# Patient Record
Sex: Male | Born: 1965
Health system: Southern US, Community
[De-identification: ages and names within clinical notes are randomized; demographics above are authoritative.]

---

## 2002-06-25 HISTORY — PX: APPENDECTOMY: SHX54

## 2006-12-25 ENCOUNTER — Ambulatory Visit: Payer: Self-pay | Admitting: Gastroenterology

## 2006-12-25 LAB — HM COLONOSCOPY

## 2007-02-04 DIAGNOSIS — L0592 Pilonidal sinus without abscess: Secondary | ICD-10-CM | POA: Insufficient documentation

## 2007-11-18 DIAGNOSIS — R319 Hematuria, unspecified: Secondary | ICD-10-CM | POA: Insufficient documentation

## 2009-04-26 DIAGNOSIS — E559 Vitamin D deficiency, unspecified: Secondary | ICD-10-CM | POA: Insufficient documentation

## 2010-07-25 ENCOUNTER — Ambulatory Visit: Payer: Self-pay

## 2012-03-20 IMAGING — CR DG CHEST 2V
1 series · 2 of 2 positions shown · non-contrast
Comparison: none

REASON FOR EXAM: chest pain
COMMENTS:

PROCEDURE:     KDR - KDXR CHEST PA (OR AP) AND LAT  - July 25, 2010 [DATE]
RESULT:     The lung fields are clear. The heart, mediastinal and osseous
structures show no significant abnormalities.

[Series 1: view not recorded · 0.17mm/px · 2 of 2 slices shown]
[im 1/2]
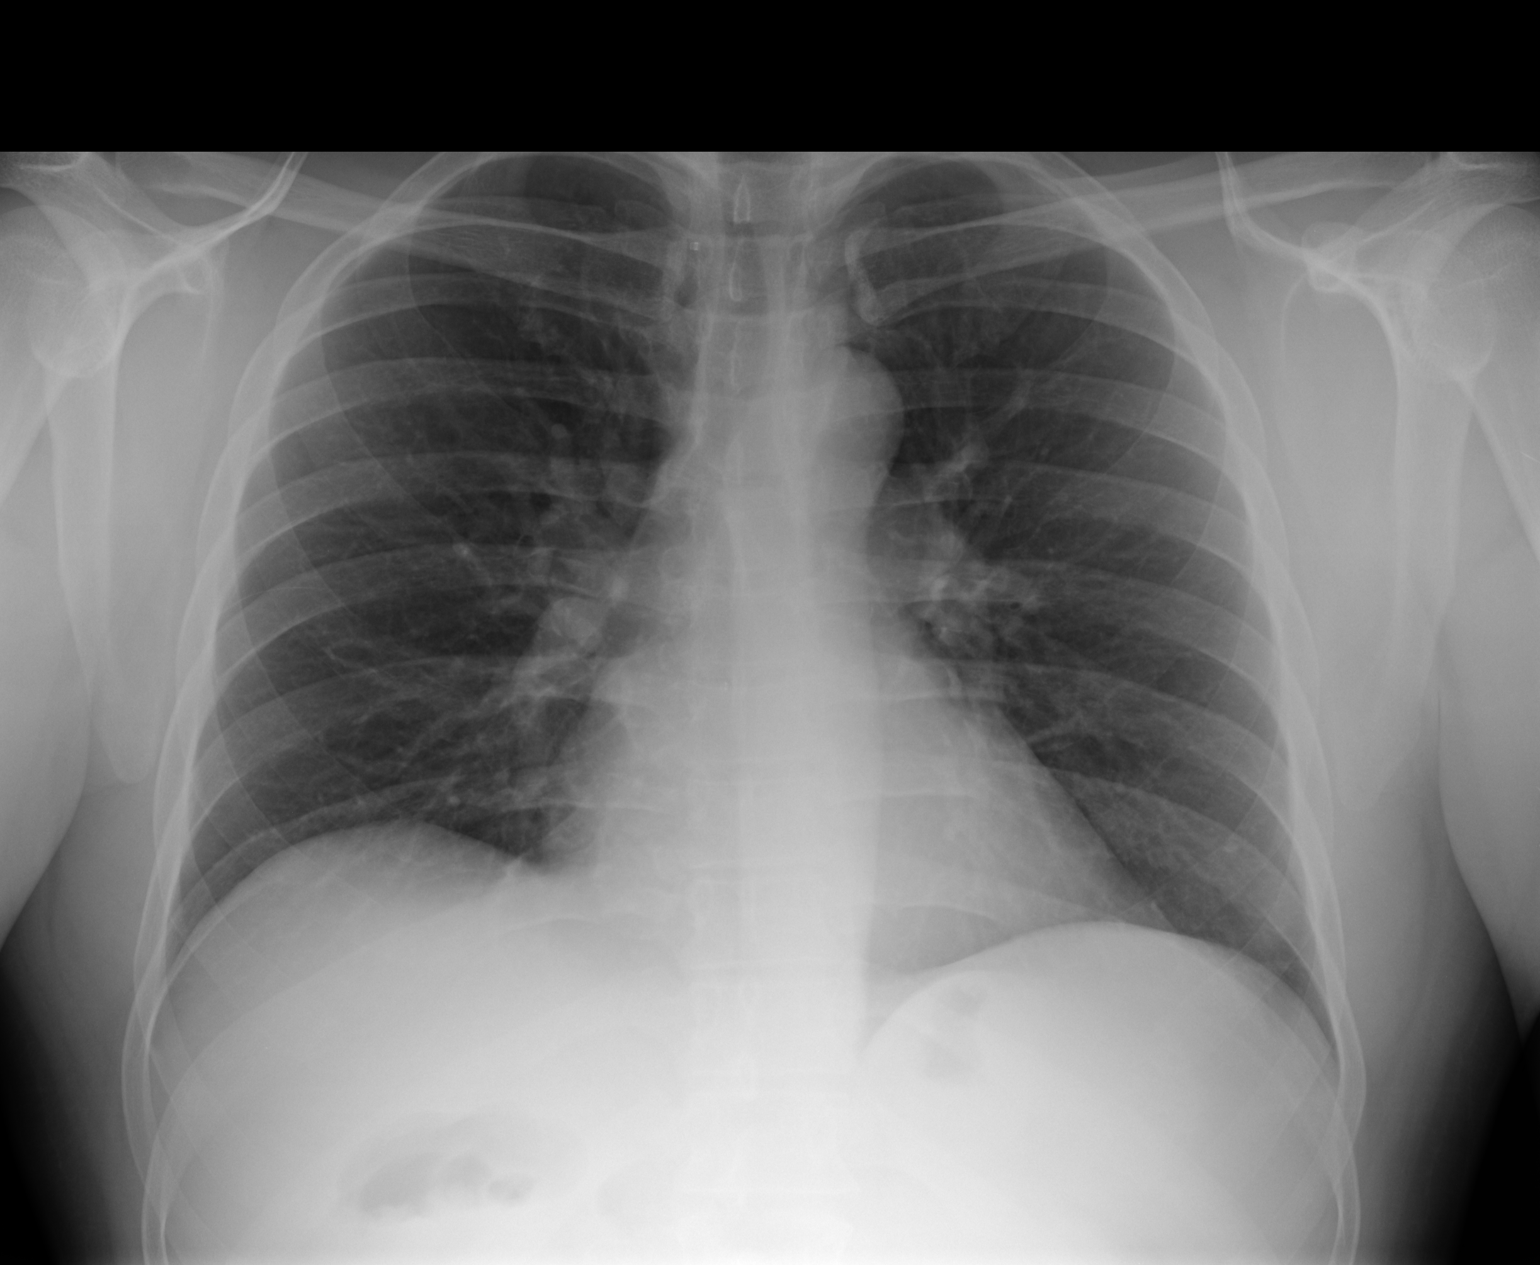
[im 2/2]
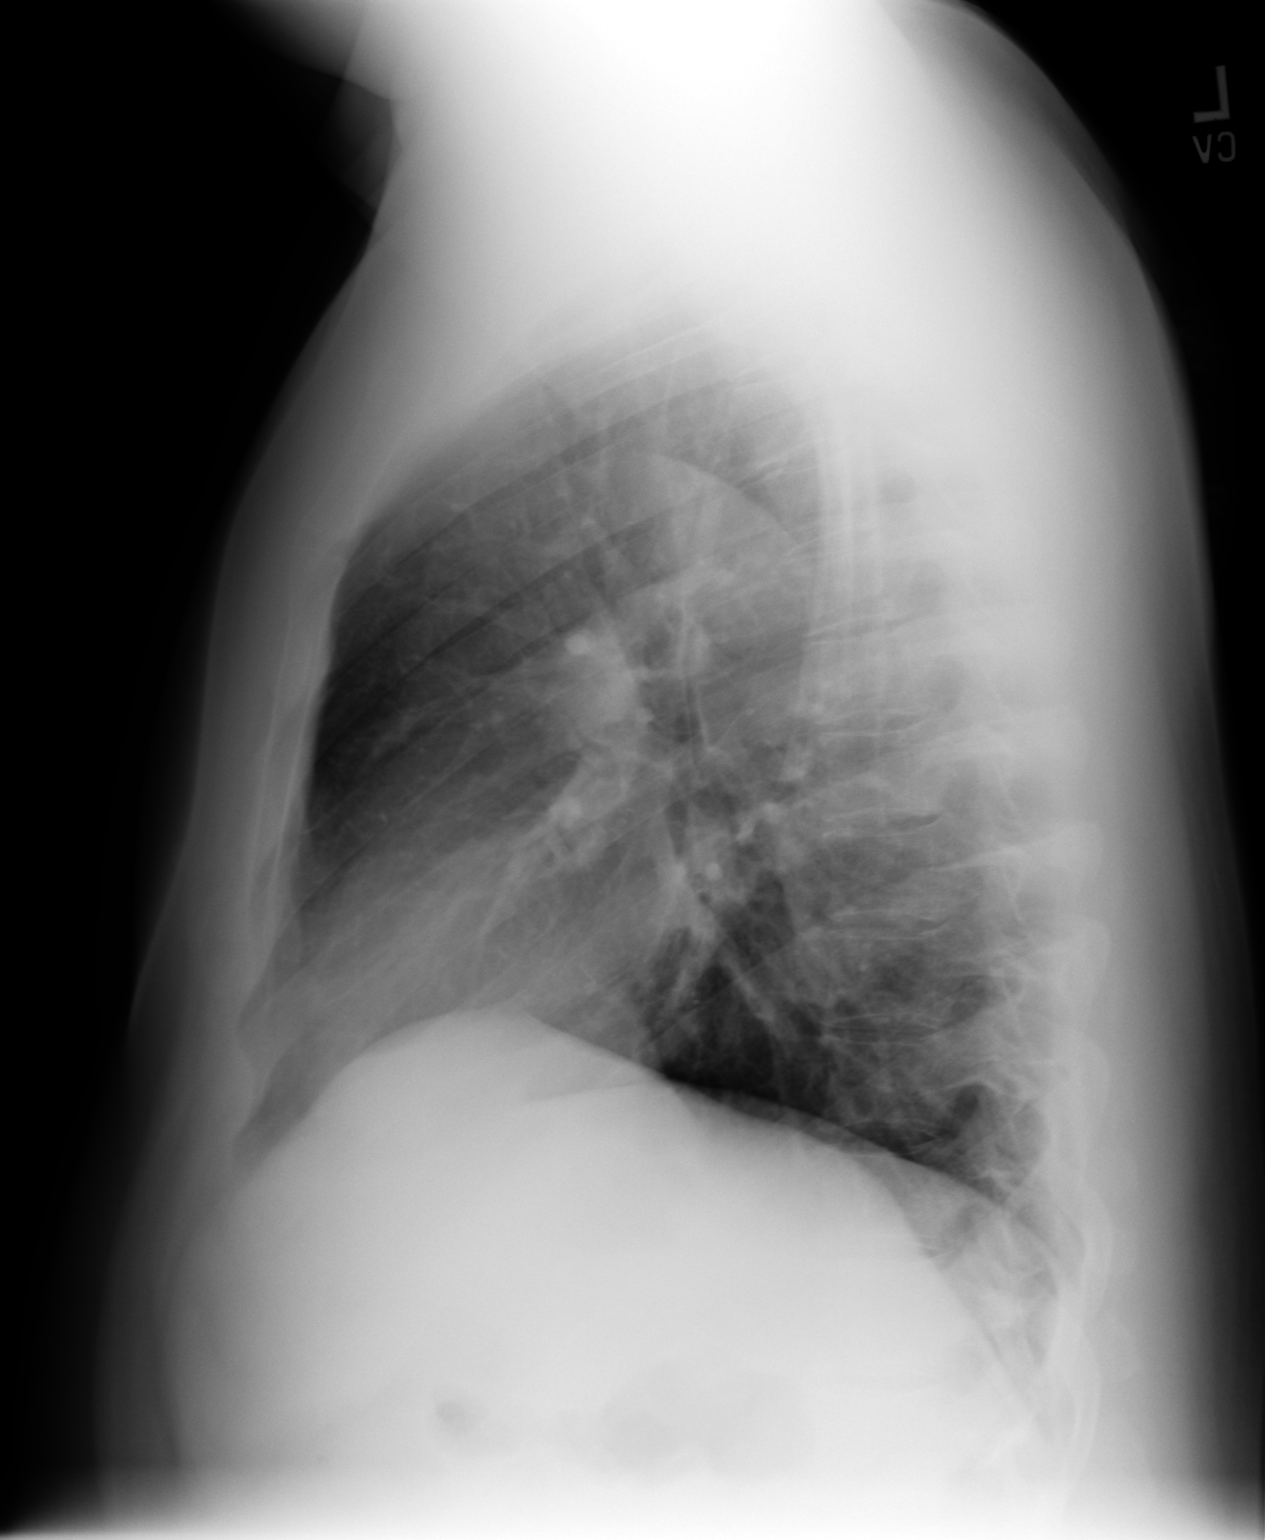

[2 of 2 positions shown; findings below may reference images not displayed]

IMPRESSION: 1.     No significant abnormalities are noted.

## 2014-04-13 LAB — BASIC METABOLIC PANEL
BUN: 12 mg/dL (ref 4–21)
CREATININE: 1.1 mg/dL (ref 0.6–1.3)
GLUCOSE: 110 mg/dL
POTASSIUM: 4.7 mmol/L (ref 3.4–5.3)
SODIUM: 139 mmol/L (ref 137–147)

## 2014-04-13 LAB — CBC AND DIFFERENTIAL
HCT: 41 % (ref 41–53)
HEMOGLOBIN: 14.2 g/dL (ref 13.5–17.5)
Neutrophils Absolute: 3 /uL
Platelets: 211 10*3/uL (ref 150–399)
WBC: 5.8 10^3/mL

## 2014-04-13 LAB — TSH: TSH: 2.46 u[IU]/mL (ref 0.41–5.90)

## 2014-04-13 LAB — LIPID PANEL
Cholesterol: 227 mg/dL — AB (ref 0–200)
HDL: 44 mg/dL (ref 35–70)
LDL Cholesterol: 160 mg/dL
LDL/HDL RATIO: 3.6
Triglycerides: 117 mg/dL (ref 40–160)

## 2014-04-13 LAB — HEPATIC FUNCTION PANEL
ALT: 16 U/L (ref 10–40)
AST: 18 U/L (ref 14–40)
Alkaline Phosphatase: 83 U/L (ref 25–125)
BILIRUBIN, TOTAL: 0.3 mg/dL

## 2014-04-13 LAB — PSA: PSA: 0.5

## 2015-11-16 ENCOUNTER — Encounter: Payer: Self-pay | Admitting: Emergency Medicine

## 2015-11-16 DIAGNOSIS — K648 Other hemorrhoids: Secondary | ICD-10-CM | POA: Insufficient documentation

## 2015-11-16 DIAGNOSIS — M1990 Unspecified osteoarthritis, unspecified site: Secondary | ICD-10-CM | POA: Insufficient documentation

## 2015-11-16 DIAGNOSIS — M940 Chondrocostal junction syndrome [Tietze]: Secondary | ICD-10-CM | POA: Insufficient documentation

## 2015-11-16 DIAGNOSIS — N429 Disorder of prostate, unspecified: Secondary | ICD-10-CM | POA: Insufficient documentation

## 2015-12-01 ENCOUNTER — Encounter: Payer: Self-pay | Admitting: Family Medicine

## 2015-12-01 ENCOUNTER — Ambulatory Visit (INDEPENDENT_AMBULATORY_CARE_PROVIDER_SITE_OTHER): Payer: BLUE CROSS/BLUE SHIELD | Admitting: Family Medicine

## 2015-12-01 VITALS — BP 118/82 | HR 72 | Temp 97.8°F | Resp 16 | Ht 72.0 in | Wt 211.0 lb

## 2015-12-01 DIAGNOSIS — Z0000 Encounter for general adult medical examination without abnormal findings: Secondary | ICD-10-CM

## 2015-12-01 DIAGNOSIS — R079 Chest pain, unspecified: Secondary | ICD-10-CM | POA: Diagnosis not present

## 2015-12-01 DIAGNOSIS — Z1211 Encounter for screening for malignant neoplasm of colon: Secondary | ICD-10-CM | POA: Diagnosis not present

## 2015-12-01 DIAGNOSIS — R195 Other fecal abnormalities: Secondary | ICD-10-CM

## 2015-12-01 DIAGNOSIS — Z125 Encounter for screening for malignant neoplasm of prostate: Secondary | ICD-10-CM

## 2015-12-01 LAB — POCT URINALYSIS DIPSTICK
BILIRUBIN UA: NEGATIVE
GLUCOSE UA: NEGATIVE
KETONES UA: NEGATIVE
Leukocytes, UA: NEGATIVE
Nitrite, UA: NEGATIVE
Protein, UA: NEGATIVE
RBC UA: NEGATIVE
SPEC GRAV UA: 1.01
UROBILINOGEN UA: 0.2
pH, UA: 5

## 2015-12-01 LAB — IFOBT (OCCULT BLOOD): IMMUNOLOGICAL FECAL OCCULT BLOOD TEST: POSITIVE

## 2015-12-01 NOTE — Patient Instructions (Signed)

## 2015-12-01 NOTE — Progress Notes (Signed)
Patient ID: Seth Graham, male   DOB: 10/11/1965, 50 y.o.   MRN: [id number]       Patient: Seth Graham, Male    DOB: 08/16/1965, 50 y.o.   MRN: [id number] Visit Date: 12/01/2015  Today's Provider: Megan Mansichard Gilbert Jr, MD   Chief Complaint  Patient presents with  . Annual Exam   Subjective:    Annual physical exam Seth Graham is a 50 y.o. male who presents today for health maintenance and complete physical. He feels fairly well. He reports he is not exercising. He reports he is sleeping fairly well.  -----------------------------------------------------------------  Colonoscopy- 12/24/16 Diverticulosis, internal hemorrhoids repeat 10 years  Immunization History  Administered Date(s) Administered  . Tdap 10/16/2006     Chest pain- Pt reports that he has chest tightness over his left side of chest. It happens at random times, no exertional. He reports that the chest pain last about 30 seconds. He has had arm tingling in the left arm, but has gone away. No shortness of breath, sweats, or nausea. Pt reports that he has only felt this a couple of times.    Review of Systems  Constitutional: Negative.   HENT: Negative.   Eyes: Negative.   Respiratory: Negative.   Cardiovascular: Positive for chest pain (tightness on left side of chest).  Gastrointestinal: Negative.   Endocrine: Negative.   Genitourinary: Negative.   Musculoskeletal: Positive for back pain.  Skin: Negative.   Allergic/Immunologic: Negative.   Neurological: Negative.   Hematological: Negative.   Psychiatric/Behavioral: Negative.     Social History      He  reports that he has never smoked. He does not have any smokeless tobacco history on file. He reports that he does not drink alcohol or use illicit drugs.       Social History   Social History  . Marital Status: Married    Spouse Name: N/A  . Number of Children: N/A  . Years of Education: N/A   Social History Main Topics  . Smoking status: Never  Smoker   . Smokeless tobacco: None  . Alcohol Use: No  . Drug Use: No  . Sexual Activity: Not Asked   Other Topics Concern  . None   Social History Narrative    History reviewed. No pertinent past medical history.   Patient Active Problem List   Diagnosis Date Noted  . Arthritis 11/16/2015  . 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 Hemorrhoids, internal 11/16/2015  . Disorder of prostate 11/16/2015  . Avitaminosis D 04/26/2009  . Blood in the urine 11/18/2007  . 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 Appendectomy  2004    Family History        Family Status  Relation Status Death Age  . Mother Alive   . Father Alive   . Sister Alive   . Sister Alive         His family history includes COPD in his father; Cancer in his father.    No Known Allergies  No outpatient prescriptions have been marked as taking for the 12/01/15 encounter (Office Visit) with Maple Hudsonichard L Gilbert Jr., MD.    Patient Care Team: Maple Hudsonichard L Gilbert Jr., MD as PCP - General (Family Medicine)     Objective:   Vitals: BP 118/82 mmHg  Pulse 72  Temp(Src) 97.8 F (36.6 C) (Oral)  Resp 16  Ht 6' (1.829 m)  Wt 211 lb (95.709 kg)  BMI 28.61 kg/m2  SpO2 97%   Physical Exam  Constitutional: He is oriented to person, place, and time. He appears well-developed and well-nourished.  HENT:  Head: Normocephalic and atraumatic.  Right Ear: External ear normal.  Left Ear: External ear normal.  Mouth/Throat: Oropharynx is clear and moist.  Eyes: Conjunctivae and EOM are normal. Pupils are equal, round, and reactive to light.  Neck: Normal range of motion. Neck supple.  Cardiovascular: Normal rate, regular rhythm, normal heart sounds and intact distal pulses.   Pulmonary/Chest: Effort normal and breath sounds normal.  Abdominal: Soft. Bowel sounds are normal.  Genitourinary: Prostate normal and penis normal.  Musculoskeletal: Normal range of motion.    Neurological: He is alert and oriented to person, place, and time. He has normal reflexes.  Skin: Skin is warm and dry.  Psychiatric: He has a normal mood and affect. His behavior is normal. Judgment and thought content normal.     Depression Screen PHQ 2/9 Scores 12/01/2015  PHQ - 2 Score 0      Assessment & Plan:     Routine Health Maintenance and Physical Exam  Exercise Activities and Dietary recommendations Goals    None      Immunization History  Administered Date(s) Administered  . Tdap 10/16/2006    Health Maintenance  Topic Date Due  . HIV Screening  08/31/1981  . INFLUENZA VACCINE  01/24/2016  . TETANUS/TDAP  10/15/2016    Stool is OC positive today--go ahead and refer for colonoscopy which is due in the next year. Discussed health benefits of physical activity, and encouraged him to engage in regular e  exercise appropriate for his age and condition.              Left Thumb Bursitis             Acute on Chronic Low Back Strain             Consider PT referral after LS xray.             I have done the exam and reviewed the above chart and it is accurate to the best of my knowledge.  --------------------------------------------------------------------

## 2015-12-02 LAB — LIPID PANEL WITH LDL/HDL RATIO
CHOLESTEROL TOTAL: 213 mg/dL — AB (ref 100–199)
HDL: 41 mg/dL (ref >39)
LDL Calculated: 143 mg/dL — ABNORMAL HIGH (ref 0–99)
LDl/HDL Ratio: 3.5 ratio units (ref 0.0–3.6)
TRIGLYCERIDES: 143 mg/dL (ref 0–149)
VLDL Cholesterol Cal: 29 mg/dL (ref 5–40)

## 2015-12-02 LAB — CBC WITH DIFFERENTIAL/PLATELET
BASOS ABS: 0 10*3/uL (ref 0.0–0.2)
Basos: 1 %
EOS (ABSOLUTE): 0.2 10*3/uL (ref 0.0–0.4)
Eos: 3 %
HEMOGLOBIN: 14.1 g/dL (ref 12.6–17.7)
Hematocrit: 41.1 % (ref 37.5–51.0)
Immature Grans (Abs): 0 10*3/uL (ref 0.0–0.1)
Immature Granulocytes: 0 %
LYMPHS ABS: 2.1 10*3/uL (ref 0.7–3.1)
Lymphs: 34 %
MCH: 26.8 pg (ref 26.6–33.0)
MCHC: 34.3 g/dL (ref 31.5–35.7)
MCV: 78 fL — ABNORMAL LOW (ref 79–97)
MONOCYTES: 10 %
Monocytes Absolute: 0.6 10*3/uL (ref 0.1–0.9)
Neutrophils Absolute: 3.3 10*3/uL (ref 1.4–7.0)
Neutrophils: 52 %
PLATELETS: 209 10*3/uL (ref 150–379)
RBC: 5.27 x10E6/uL (ref 4.14–5.80)
RDW: 13.7 % (ref 12.3–15.4)
WBC: 6.3 10*3/uL (ref 3.4–10.8)

## 2015-12-02 LAB — PSA: PROSTATE SPECIFIC AG, SERUM: 0.7 ng/mL (ref 0.0–4.0)

## 2015-12-02 LAB — COMPREHENSIVE METABOLIC PANEL
A/G RATIO: 1.7 (ref 1.2–2.2)
ALBUMIN: 4.5 g/dL (ref 3.5–5.5)
ALT: 18 IU/L (ref 0–44)
AST: 19 IU/L (ref 0–40)
Alkaline Phosphatase: 84 IU/L (ref 39–117)
BUN/Creatinine Ratio: 15 (ref 9–20)
BUN: 14 mg/dL (ref 6–24)
Bilirubin Total: 0.3 mg/dL (ref 0.0–1.2)
CALCIUM: 9.3 mg/dL (ref 8.7–10.2)
CO2: 26 mmol/L (ref 18–29)
CREATININE: 0.92 mg/dL (ref 0.76–1.27)
Chloride: 100 mmol/L (ref 96–106)
GFR calc Af Amer: 113 mL/min/{1.73_m2} (ref >59)
GFR calc non Af Amer: 97 mL/min/{1.73_m2} (ref >59)
GLOBULIN, TOTAL: 2.6 g/dL (ref 1.5–4.5)
Glucose: 100 mg/dL — ABNORMAL HIGH (ref 65–99)
POTASSIUM: 4.6 mmol/L (ref 3.5–5.2)
SODIUM: 140 mmol/L (ref 134–144)
TOTAL PROTEIN: 7.1 g/dL (ref 6.0–8.5)

## 2015-12-02 LAB — TSH: TSH: 2.28 u[IU]/mL (ref 0.450–4.500)

## 2015-12-05 ENCOUNTER — Telehealth: Payer: Self-pay | Admitting: Family Medicine

## 2015-12-05 NOTE — Telephone Encounter (Signed)
Pt states he received a call and is returning the call.  He states he think it is his lab results.

## 2015-12-14 ENCOUNTER — Encounter: Payer: Self-pay | Admitting: Family Medicine

## 2016-03-05 ENCOUNTER — Ambulatory Visit: Payer: Self-pay | Admitting: Family Medicine

## 2016-03-14 ENCOUNTER — Ambulatory Visit (INDEPENDENT_AMBULATORY_CARE_PROVIDER_SITE_OTHER): Payer: BLUE CROSS/BLUE SHIELD | Admitting: Family Medicine

## 2016-03-14 VITALS — BP 118/84 | HR 60 | Temp 98.3°F | Resp 12 | Wt 209.0 lb

## 2016-03-14 DIAGNOSIS — R7989 Other specified abnormal findings of blood chemistry: Secondary | ICD-10-CM

## 2016-03-14 DIAGNOSIS — R059 Cough, unspecified: Secondary | ICD-10-CM

## 2016-03-14 DIAGNOSIS — Z1211 Encounter for screening for malignant neoplasm of colon: Secondary | ICD-10-CM

## 2016-03-14 DIAGNOSIS — S39012A Strain of muscle, fascia and tendon of lower back, initial encounter: Secondary | ICD-10-CM

## 2016-03-14 DIAGNOSIS — R05 Cough: Secondary | ICD-10-CM

## 2016-03-14 MED ORDER — OMEPRAZOLE 20 MG PO CPDR: 20.0000 mg | DELAYED_RELEASE_CAPSULE | Freq: Every day | ORAL | 12 refills | Status: DC

## 2016-03-14 MED ORDER — LORATADINE 10 MG PO TABS: 10.0000 mg | ORAL_TABLET | Freq: Every day | ORAL | 11 refills | Status: DC

## 2016-03-14 MED ORDER — GUAIFENESIN ER 600 MG PO TB12: 600.0000 mg | ORAL_TABLET | Freq: Every day | ORAL | 0 refills | Status: DC | PRN

## 2016-03-14 NOTE — Progress Notes (Signed)
Seth GritCharles Graham  MRN: [id number] DOB: 12/25/1965  Subjective:  HPI  Patient is here for 3 months follow up on labs. Las labs were done in June and possible iron def anemia was mentioned. He was to come back in and have labs re checked. He does have some fatigue and can not say if it is a normal issue or not. Also states he has had some pain for a while in the lower back right side and hip area.  Patient Active Problem List   Diagnosis Date Noted  . Arthritis 11/16/2015  . 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 Hemorrhoids, internal 11/16/2015  . Disorder of prostate 11/16/2015  . Avitaminosis D 04/26/2009  . Blood in the urine 11/18/2007  . 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  Social History   Social History  . Marital status: Married    Spouse name: N/A  . Number of children: N/A  . Years of education: N/A   Occupational History  . Not on file.   Social History Main Topics  . Smoking status: Never Smoker  . Smokeless tobacco: Not on file  . Alcohol use No  . Drug use: No  . Sexual activity: Not on file   Other Topics Concern  . Not on file   Social History Narrative  . No narrative on file    Outpatient Encounter Prescriptions as of 03/14/2016  Medication Sig Note  . fluticasone (FLONASE) 50 MCG/ACT nasal spray  12/01/2015: [facility]   No facility-administered encounter medications on file as of 03/14/2016.     No Known Allergies  Review of Systems  Constitutional: Positive for malaise/fatigue.  Respiratory: Positive for cough (at times).   Cardiovascular: Negative.   Musculoskeletal: Positive for back pain and joint pain.  Neurological: Negative.    Objective:  BP 118/84   Pulse 60   Temp 98.3 F (36.8 C)   Resp 12   Wt 209 lb (94.8 kg)   BMI 28.35 kg/m   Physical Exam  Constitutional: He is oriented to person, place, and time and well-developed, well-nourished, and in no distress.  HENT:    Head: Normocephalic and atraumatic.  Eyes: Conjunctivae are normal. Pupils are equal, round, and reactive to light.  Neck: Normal range of motion. Neck supple.  Cardiovascular: Normal rate, regular rhythm, normal heart sounds and intact distal pulses.   No murmur heard. Pulmonary/Chest: Effort normal and breath sounds normal. No respiratory distress. He has no wheezes.  Musculoskeletal: He exhibits no edema, tenderness or deformity.  Negative 4 figure -could be psoas issue  Neurological: He is alert and oriented to person, place, and time. No cranial nerve deficit. He exhibits normal muscle tone. Gait normal. Coordination normal.  Skin: Skin is warm and dry.  Psychiatric: Mood, memory, affect and judgment normal.    Assessment and Plan :  1. Low back strain, initial encounter Possible psois strain. Advised patient to stretch and follow for now. Patient wants to hold off on further work up at this time. I think this is musculoskeletal. Could definitely benefit from a physical therapy referral. Will let us know.. 2. Abnormal MCV Re check levels today.  3. Cough Advised patient to try Loratadine, Flonase and Mucinex OTC for about 1 month. Then add Omeprazole if symptoms do not improve. AR vs GERD. May need referral to allergist. Follow.  HPI, Exam and A&P transcribed under direction and in the presence of Julieanne Mansonichard Gilbert, MD.

## 2016-03-15 LAB — CBC WITH DIFFERENTIAL/PLATELET
BASOS ABS: 0 10*3/uL (ref 0.0–0.2)
Basos: 1 %
EOS (ABSOLUTE): 0.2 10*3/uL (ref 0.0–0.4)
Eos: 3 %
HEMOGLOBIN: 14.6 g/dL (ref 12.6–17.7)
Hematocrit: 42.1 % (ref 37.5–51.0)
IMMATURE GRANS (ABS): 0 10*3/uL (ref 0.0–0.1)
IMMATURE GRANULOCYTES: 0 %
LYMPHS: 35 %
Lymphocytes Absolute: 2.3 10*3/uL (ref 0.7–3.1)
MCH: 27.1 pg (ref 26.6–33.0)
MCHC: 34.7 g/dL (ref 31.5–35.7)
MCV: 78 fL — ABNORMAL LOW (ref 79–97)
MONOCYTES: 10 %
Monocytes Absolute: 0.6 10*3/uL (ref 0.1–0.9)
NEUTROS PCT: 51 %
Neutrophils Absolute: 3.4 10*3/uL (ref 1.4–7.0)
PLATELETS: 214 10*3/uL (ref 150–379)
RBC: 5.38 x10E6/uL (ref 4.14–5.80)
RDW: 14.5 % (ref 12.3–15.4)
WBC: 6.5 10*3/uL (ref 3.4–10.8)

## 2016-03-15 LAB — IRON: Iron: 115 ug/dL (ref 38–169)

## 2016-12-03 ENCOUNTER — Ambulatory Visit (INDEPENDENT_AMBULATORY_CARE_PROVIDER_SITE_OTHER): Payer: BLUE CROSS/BLUE SHIELD | Admitting: Family Medicine

## 2016-12-03 ENCOUNTER — Encounter: Payer: Self-pay | Admitting: Family Medicine

## 2016-12-03 VITALS — BP 114/62 | HR 70 | Temp 97.5°F | Resp 16 | Ht 72.0 in | Wt 210.0 lb

## 2016-12-03 DIAGNOSIS — Z1211 Encounter for screening for malignant neoplasm of colon: Secondary | ICD-10-CM | POA: Diagnosis not present

## 2016-12-03 DIAGNOSIS — Z23 Encounter for immunization: Secondary | ICD-10-CM

## 2016-12-03 DIAGNOSIS — Z0000 Encounter for general adult medical examination without abnormal findings: Secondary | ICD-10-CM

## 2016-12-03 DIAGNOSIS — Z125 Encounter for screening for malignant neoplasm of prostate: Secondary | ICD-10-CM

## 2016-12-03 LAB — POCT URINALYSIS DIPSTICK
Bilirubin, UA: NEGATIVE
Blood, UA: NEGATIVE
Glucose, UA: NEGATIVE
Ketones, UA: NEGATIVE
Leukocytes, UA: NEGATIVE
NITRITE UA: NEGATIVE
PH UA: 6 (ref 5.0–8.0)
PROTEIN UA: NEGATIVE
Spec Grav, UA: 1.01 (ref 1.010–1.025)
UROBILINOGEN UA: 0.2 U/dL

## 2016-12-03 NOTE — Progress Notes (Signed)
Patient: Seth Graham, Male    DOB: 21-Jul-1965, 51 y.o.   MRN: [id number] Visit Date: 12/03/2016  Today's Provider: [name], MD   Chief Complaint  Patient presents with  . Annual Exam   Subjective:    Annual physical exam Seth Graham is a 51 y.o. male who presents today for health maintenance and complete physical. He feels well. He reports he is not exercising. He reports he is sleeping well.  ----------------------------------------------------------------- Colonoscopy- 12/25/06 diverticulosis and internal hemorrhoids  Immunization History  Administered Date(s) Administered  . Tdap 10/16/2006     Review of Systems  Constitutional: Negative.   HENT: Positive for sinus pressure.   Eyes: Negative.   Respiratory: Negative.   Cardiovascular: Negative.   Gastrointestinal: Negative.   Endocrine: Negative.   Genitourinary: Negative.   Musculoskeletal: Negative.   Skin: Positive for rash.  Allergic/Immunologic: Negative.   Neurological: Positive for headaches.  Hematological: Negative.   Psychiatric/Behavioral: Negative.     Social History      He  reports that he has never smoked. He has never used smokeless tobacco. He reports that he does not drink alcohol or use drugs.       Social History   Social History  . Marital status: Married    Spouse name: N/A  . Number of children: N/A  . Years of education: N/A   Social History Main Topics  . Smoking status: Never Smoker  . Smokeless tobacco: Never Used  . Alcohol use No  . Drug use: No  . Sexual activity: Not Asked   Other Topics Concern  . None   Social History Narrative  . None    History reviewed. No pertinent past medical history.   Patient Active Problem List   Diagnosis Date Noted  . Arthritis 11/16/2015  . 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 Hemorrhoids, internal 11/16/2015  . Disorder of prostate 11/16/2015  . Avitaminosis D 04/26/2009  . Blood in the urine 11/18/2007  .  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 APPENDECTOMY  2004    Family History        Family Status  Relation Status  . Mother Alive  . Father Alive  . Sister Alive  . Sister Alive        His family history includes COPD in his father; Cancer in his father.     No Known Allergies   Current Outpatient Prescriptions:  .  fluticasone (FLONASE) 50 MCG/ACT nasal spray, , Disp: , Rfl:  .  guaiFENesin (MUCINEX) 600 MG 12 hr tablet, Take 1 tablet (600 mg total) by mouth daily as needed. (Patient not taking: Reported on 12/03/2016), Disp: 1 tablet, Rfl: 0 .  loratadine (CLARITIN) 10 MG tablet, Take 1 tablet (10 mg total) by mouth daily. (Patient not taking: Reported on 12/03/2016), Disp: 30 tablet, Rfl: 11 .  omeprazole (PRILOSEC) 20 MG capsule, Take 1 capsule (20 mg total) by mouth daily. (Patient not taking: Reported on 12/03/2016), Disp: 30 capsule, Rfl: 12   Patient Care Team: Maple Hudson., MD as PCP - General (Family Medicine)      Objective:   Vitals: BP 114/62 (BP Location: Left Arm, Patient Position: Sitting, Cuff Size: Large)   Pulse 70   Temp 97.5 F (36.4 C) (Oral)   Resp 16   Ht 6' (1.829 m)   Wt 210 lb (95.3 kg)   BMI 28.48 kg/m  Vitals:   12/03/16 0921  BP: 114/62  Pulse: 70  Resp: 16  Temp: 97.5 F (36.4 C)  TempSrc: Oral  Weight: 210 lb (95.3 kg)  Height: 6' (1.829 m)     Physical Exam  Constitutional: He is oriented to person, place, and time. He appears well-developed and well-nourished.  HENT:  Head: Normocephalic and atraumatic.  Right Ear: External ear normal.  Left Ear: External ear normal.  Nose: Nose normal.  Mouth/Throat: Oropharynx is clear and moist.  Eyes: Conjunctivae and EOM are normal. Pupils are equal, round, and reactive to light.  Neck: Normal range of motion. Neck supple.  Cardiovascular: Normal rate, regular rhythm, normal heart sounds and intact distal pulses.     Pulmonary/Chest: Effort normal and breath sounds normal.  Abdominal: Soft. Bowel sounds are normal.  Genitourinary: Rectum normal, prostate normal and penis normal.  Musculoskeletal: Normal range of motion.  Neurological: He is alert and oriented to person, place, and time. He has normal reflexes.  Skin: Skin is warm and dry.  Psychiatric: He has a normal mood and affect. His behavior is normal. Judgment and thought content normal.     Depression Screen PHQ 2/9 Scores 12/03/2016 12/01/2015  PHQ - 2 Score 0 0  PHQ- 9 Score 2 -      Assessment & Plan:     Routine Health Maintenance and Physical Exam  Exercise Activities and Dietary recommendations Goals    None      Immunization History  Administered Date(s) Administered  . Tdap 10/16/2006    Health Maintenance  Topic Date Due  . HIV Screening  08/31/1981  . TETANUS/TDAP  10/15/2016  . COLONOSCOPY  12/24/2016  . INFLUENZA VACCINE  01/23/2017   Screening colonoscopy--refer to GI. Td given today. Discussed health benefits of physical activity, and encouraged him to engage in regular exercise appropriate for his age and condition.    --------------------------------------------------------------------   I have done the exam and reviewed the above chart and it is accurate to the best of my knowledge. [employer]  technology has been used in this note in any air is in the dictation or transcription are unintentional.  Megan Mansichard Maecy Podgurski Jr, MD  [facility]

## 2016-12-04 LAB — LIPID PANEL WITH LDL/HDL RATIO
CHOLESTEROL TOTAL: 222 mg/dL — AB (ref 100–199)
HDL: 38 mg/dL — AB (ref >39)
LDL Calculated: 159 mg/dL — ABNORMAL HIGH (ref 0–99)
LDL/HDL RATIO: 4.2 ratio — AB (ref 0.0–3.6)
TRIGLYCERIDES: 123 mg/dL (ref 0–149)
VLDL Cholesterol Cal: 25 mg/dL (ref 5–40)

## 2016-12-04 LAB — COMPREHENSIVE METABOLIC PANEL
ALBUMIN: 4.5 g/dL (ref 3.5–5.5)
ALK PHOS: 85 IU/L (ref 39–117)
ALT: 17 IU/L (ref 0–44)
AST: 18 IU/L (ref 0–40)
Albumin/Globulin Ratio: 1.7 (ref 1.2–2.2)
BUN / CREAT RATIO: 13 (ref 9–20)
BUN: 14 mg/dL (ref 6–24)
Bilirubin Total: 0.4 mg/dL (ref 0.0–1.2)
CALCIUM: 9.6 mg/dL (ref 8.7–10.2)
CO2: 26 mmol/L (ref 20–29)
CREATININE: 1.04 mg/dL (ref 0.76–1.27)
Chloride: 104 mmol/L (ref 96–106)
GFR calc Af Amer: 96 mL/min/{1.73_m2} (ref >59)
GFR, EST NON AFRICAN AMERICAN: 83 mL/min/{1.73_m2} (ref >59)
GLOBULIN, TOTAL: 2.7 g/dL (ref 1.5–4.5)
Glucose: 104 mg/dL — ABNORMAL HIGH (ref 65–99)
Potassium: 4.7 mmol/L (ref 3.5–5.2)
Sodium: 142 mmol/L (ref 134–144)
TOTAL PROTEIN: 7.2 g/dL (ref 6.0–8.5)

## 2016-12-04 LAB — CBC WITH DIFFERENTIAL/PLATELET
BASOS ABS: 0 10*3/uL (ref 0.0–0.2)
Basos: 1 %
EOS (ABSOLUTE): 0.1 10*3/uL (ref 0.0–0.4)
EOS: 2 %
Hematocrit: 41.2 % (ref 37.5–51.0)
Hemoglobin: 13.8 g/dL (ref 13.0–17.7)
IMMATURE GRANULOCYTES: 0 %
Immature Grans (Abs): 0 10*3/uL (ref 0.0–0.1)
Lymphocytes Absolute: 2.4 10*3/uL (ref 0.7–3.1)
Lymphs: 38 %
MCH: 26.6 pg (ref 26.6–33.0)
MCHC: 33.5 g/dL (ref 31.5–35.7)
MCV: 80 fL (ref 79–97)
Monocytes Absolute: 0.5 10*3/uL (ref 0.1–0.9)
Monocytes: 9 %
NEUTROS PCT: 50 %
Neutrophils Absolute: 3.1 10*3/uL (ref 1.4–7.0)
PLATELETS: 202 10*3/uL (ref 150–379)
RBC: 5.18 x10E6/uL (ref 4.14–5.80)
RDW: 15.1 % (ref 12.3–15.4)
WBC: 6.2 10*3/uL (ref 3.4–10.8)

## 2016-12-04 LAB — TSH: TSH: 2.32 u[IU]/mL (ref 0.450–4.500)

## 2016-12-04 LAB — PSA: Prostate Specific Ag, Serum: 0.7 ng/mL (ref 0.0–4.0)

## 2016-12-04 NOTE — Progress Notes (Signed)
Advised  ED 

## 2016-12-19 ENCOUNTER — Telehealth: Payer: Self-pay

## 2016-12-19 ENCOUNTER — Other Ambulatory Visit: Payer: Self-pay

## 2016-12-19 DIAGNOSIS — Z1211 Encounter for screening for malignant neoplasm of colon: Secondary | ICD-10-CM

## 2016-12-19 NOTE — Telephone Encounter (Signed)
Gastroenterology Pre-Procedure Review  Request Date:  Requesting Physician: Dr.   PATIENT REVIEW QUESTIONS: The patient responded to the following health history questions as indicated:    1. Are you having any GI issues? no 2. Do you have a personal history of Polyps? no 3. Do you have a family history of Colon Cancer or Polyps? no 4. Diabetes Mellitus? no 5. Joint replacements in the past 12 months?no 6. Major health problems in the past 3 months?no 7. Any artificial heart valves, MVP, or defibrillator?no    MEDICATIONS & ALLERGIES:    Patient reports the following regarding taking any anticoagulation/antiplatelet therapy:   Plavix, Coumadin, Eliquis, Xarelto, Lovenox, Pradaxa, Brilinta, or Effient? no Aspirin? no  Patient confirms/reports the following medications:  Current Outpatient Prescriptions  Medication Sig Dispense Refill  . fluticasone (FLONASE) 50 MCG/ACT nasal spray     . guaiFENesin (MUCINEX) 600 MG 12 hr tablet Take 1 tablet (600 mg total) by mouth daily as needed. (Patient not taking: Reported on 12/03/2016) 1 tablet 0  . loratadine (CLARITIN) 10 MG tablet Take 1 tablet (10 mg total) by mouth daily. (Patient not taking: Reported on 12/03/2016) 30 tablet 11  . omeprazole (PRILOSEC) 20 MG capsule Take 1 capsule (20 mg total) by mouth daily. (Patient not taking: Reported on 12/03/2016) 30 capsule 12   No current facility-administered medications for this visit.     Patient confirms/reports the following allergies:  No Known Allergies  No orders of the defined types were placed in this encounter.   AUTHORIZATION INFORMATION Primary Insurance: 1D#: Group #:  Secondary Insurance: 1D#: Group #:  SCHEDULE INFORMATION: Date: 02/01/17 Time: Location: [facility]

## 2017-02-01 ENCOUNTER — Encounter: Payer: Self-pay | Admitting: Anesthesiology

## 2017-02-01 ENCOUNTER — Ambulatory Visit: Payer: BLUE CROSS/BLUE SHIELD | Admitting: Anesthesiology

## 2017-02-01 ENCOUNTER — Encounter
Admission: RE | Disposition: A | Discharge status: Home / Self Care | Payer: Self-pay | Source: Ambulatory Visit | Attending: Gastroenterology

## 2017-02-01 ENCOUNTER — Ambulatory Visit
Admission: RE | Admit: 2017-02-01 | Discharge: 2017-02-01 | Disposition: A | Discharge status: Home / Self Care | Payer: BLUE CROSS/BLUE SHIELD | Source: Ambulatory Visit | Attending: Gastroenterology | Admitting: Gastroenterology

## 2017-02-01 DIAGNOSIS — Z79899 Other long term (current) drug therapy: Secondary | ICD-10-CM | POA: Insufficient documentation

## 2017-02-01 DIAGNOSIS — K5790 Diverticulosis of intestine, part unspecified, without perforation or abscess without bleeding: Secondary | ICD-10-CM | POA: Diagnosis not present

## 2017-02-01 DIAGNOSIS — K5730 Diverticulosis of large intestine without perforation or abscess without bleeding: Secondary | ICD-10-CM | POA: Insufficient documentation

## 2017-02-01 DIAGNOSIS — K640 First degree hemorrhoids: Secondary | ICD-10-CM | POA: Insufficient documentation

## 2017-02-01 DIAGNOSIS — M1990 Unspecified osteoarthritis, unspecified site: Secondary | ICD-10-CM | POA: Diagnosis not present

## 2017-02-01 DIAGNOSIS — Z1211 Encounter for screening for malignant neoplasm of colon: Secondary | ICD-10-CM | POA: Diagnosis not present

## 2017-02-01 DIAGNOSIS — K648 Other hemorrhoids: Secondary | ICD-10-CM | POA: Diagnosis not present

## 2017-02-01 HISTORY — PX: COLONOSCOPY WITH PROPOFOL: SHX5780

## 2017-02-01 SURGERY — COLONOSCOPY WITH PROPOFOL
Anesthesia: General

## 2017-02-01 MED ORDER — MIDAZOLAM HCL 2 MG/2ML IJ SOLN
INTRAMUSCULAR | Status: AC
  Filled 2017-02-01: qty 2

## 2017-02-01 MED ORDER — PROPOFOL 500 MG/50ML IV EMUL
INTRAVENOUS | Status: DC | PRN
  Administered 2017-02-01: 140 ug/kg/min via INTRAVENOUS

## 2017-02-01 MED ORDER — PROPOFOL 500 MG/50ML IV EMUL
INTRAVENOUS | Status: AC
  Filled 2017-02-01: qty 150

## 2017-02-01 MED ORDER — MIDAZOLAM HCL 2 MG/2ML IJ SOLN
INTRAMUSCULAR | Status: DC | PRN
  Administered 2017-02-01 (×3): 1 mg via INTRAVENOUS

## 2017-02-01 MED ORDER — SODIUM CHLORIDE 0.9 % IV SOLN
INTRAVENOUS | Status: DC
  Administered 2017-02-01: 08:00:00 via INTRAVENOUS

## 2017-02-01 MED ORDER — PROPOFOL 10 MG/ML IV BOLUS
INTRAVENOUS | Status: DC | PRN
  Administered 2017-02-01: 30 mg via INTRAVENOUS
  Administered 2017-02-01: 70 mg via INTRAVENOUS
  Administered 2017-02-01: 40 mg via INTRAVENOUS

## 2017-02-01 NOTE — Anesthesia Postprocedure Evaluation (Signed)
Anesthesia Post Note  Patient: Seth FrayCharles Alvin Carll Jr.  Procedure(s) Performed: Procedure(s) (LRB): COLONOSCOPY WITH PROPOFOL (N/A)  Patient location during evaluation: PACU Anesthesia Type: General Level of consciousness: awake and alert and oriented Pain management: pain level controlled Vital Signs Assessment: post-procedure vital signs reviewed and stable Respiratory status: spontaneous breathing Cardiovascular status: blood pressure returned to baseline Anesthetic complications: no     Last Vitals:  Vitals:   02/01/17 0906 02/01/17 0916  BP: 115/83 118/85  Pulse: 75 65  Resp: 20 14  Temp:    SpO2: 99% 100%    Last Pain:  Vitals:   02/01/17 0836  TempSrc: Tympanic                 Koen Antilla

## 2017-02-01 NOTE — Anesthesia Preprocedure Evaluation (Signed)
Anesthesia Evaluation  Patient identified by MRN, date of birth, ID band Patient awake    Reviewed: Allergy & Precautions, NPO status , Patient's Chart, lab work & pertinent test results  Airway Mallampati: II       Dental no notable dental hx.    Pulmonary neg pulmonary ROS,    Pulmonary exam normal        Cardiovascular negative cardio ROS Normal cardiovascular exam     Neuro/Psych negative neurological ROS     GI/Hepatic Neg liver ROS,   Endo/Other  negative endocrine ROS  Renal/GU negative Renal ROS     Musculoskeletal  (+) Arthritis , Osteoarthritis,    Abdominal Normal abdominal exam  (+)   Peds  Hematology negative hematology ROS (+)   Anesthesia Other Findings   Reproductive/Obstetrics                             Anesthesia Physical Anesthesia Plan  ASA: II  Anesthesia Plan: General   Post-op Pain Management:    Induction:   PONV Risk Score and Plan:   Airway Management Planned: Nasal Cannula  Additional Equipment:   Intra-op Plan:   Post-operative Plan:   Informed Consent: I have reviewed the patients History and Physical, chart, labs and discussed the procedure including the risks, benefits and alternatives for the proposed anesthesia with the patient or authorized representative who has indicated his/her understanding and acceptance.   Dental advisory given  Plan Discussed with: Surgeon and CRNA  Anesthesia Plan Comments:         Anesthesia Quick Evaluation

## 2017-02-01 NOTE — H&P (Signed)
  Wyline MoodKiran Tonnette Zwiebel MD [address] [place], [place] [address] Phone: [phone] Fax : [phone]  Primary Care Physician:  Maple HudsonGilbert, Richard L Jr., MD Primary Gastroenterologist:  Dr. Wyline MoodKiran Madelynn Malson   Pre-Procedure History & Physical: HPI:  Seth FrayCharles Alvin Lybrand Jr. is a 51 y.o. male is here for an colonoscopy.   History reviewed. No pertinent past medical history.  Past Surgical History:  Procedure Laterality Date  . APPENDECTOMY  2004    Prior to Admission medications   Medication Sig Start Date End Date Taking? Authorizing Provider  cetirizine-pseudoephedrine (ZYRTEC-D) 5-120 MG tablet Take 1 tablet by mouth daily as needed for allergies.   Yes [provider]  fluticasone (FLONASE) 50 MCG/ACT nasal spray     [provider]  guaiFENesin (MUCINEX) 600 MG 12 hr tablet Take 1 tablet (600 mg total) by mouth daily as needed. Patient not taking: Reported on 12/03/2016 03/14/16   Maple HudsonGilbert, Richard L Jr., MD  loratadine (CLARITIN) 10 MG tablet Take 1 tablet (10 mg total) by mouth daily. Patient not taking: Reported on 12/03/2016 03/14/16   Maple HudsonGilbert, Richard L Jr., MD  omeprazole (PRILOSEC) 20 MG capsule Take 1 capsule (20 mg total) by mouth daily. Patient not taking: Reported on 12/03/2016 03/14/16   Maple HudsonGilbert, Richard L Jr., MD    Allergies as of 12/19/2016  . (No Known Allergies)    Family History  Problem Relation Age of Onset  . COPD Father   . Cancer Father        prostate and colorectal adenoma    Social History   Social History  . Marital status: Married    Spouse name: N/A  . Number of children: N/A  . Years of education: N/A   Occupational History  . Not on file.   Social History Main Topics  . Smoking status: Never Smoker  . Smokeless tobacco: Never Used  . Alcohol use No  . Drug use: No  . Sexual activity: Not on file   Other Topics Concern  . Not on file   Social History Narrative  . No narrative on file    Review of Systems: See  HPI, otherwise negative ROS  Physical Exam: BP 115/79   Pulse 79   Temp 97.8 F (36.6 C) (Tympanic)   Resp 18   Ht 5\' 11"  (1.803 m)   Wt 205 lb (93 kg)   SpO2 98%   BMI 28.59 kg/m  General:   Alert,  pleasant and cooperative in NAD Head:  Normocephalic and atraumatic. Neck:  Supple; no masses or thyromegaly. Lungs:  Clear throughout to auscultation.    Heart:  Regular rate and rhythm. Abdomen:  Soft, nontender and nondistended. Normal bowel sounds, without guarding, and without rebound.   Neurologic:  Alert and  oriented x4;  grossly normal neurologically.  Impression/Plan: Seth Frayharles Alvin Seth Jr. is here for an colonoscopy to be performed for Screening colonoscopy average risk    Risks, benefits, limitations, and alternatives regarding  colonoscopy have been reviewed with the patient.  Questions have been answered.  All parties agreeable.   Wyline MoodKiran Sevan Mcbroom, MD  02/01/2017, 7:54 AM

## 2017-02-01 NOTE — Op Note (Addendum)
[facility] Gastroenterology Patient Name: Seth Graham Procedure Date: 02/01/2017 8:00 AM MRN: [id number] Account #: [id number] Date of Birth: [birth year] Admit Type: Outpatient Age: 51 Room: [facility] ENDO ROOM 4 Gender: Male Note Status: Supervisor Override Procedure:            Colonoscopy Indications:          Screening for colorectal malignant neoplasm Providers:            Wyline Mood MD, MD Referring MD:         Ferdinand Lango. Sullivan Lone, MD (Referring MD) Medicines:            Monitored Anesthesia Care Complications:        No immediate complications. Procedure:            Pre-Anesthesia Assessment:                       - ASA Grade Assessment: II - A patient with mild                        systemic disease.                       - Prior to the procedure, a History and Physical was                        performed, and patient medications, allergies and                        sensitivities were reviewed. The patient's tolerance of                        previous anesthesia was reviewed.                       - The risks and benefits of the procedure and the                        sedation options and risks were discussed with the                        patient. All questions were answered and informed                        consent was obtained.                       After obtaining informed consent, the colonoscope was                        passed under direct vision. Throughout the procedure,                        the patient's blood pressure, pulse, and oxygen                        saturations were monitored continuously. The                        Colonoscope was introduced through the anus and  advanced to the the cecum, identified by the                        appendiceal orifice, IC valve and transillumination.                        The colonoscopy was somewhat difficult due to a                        tortuous colon and the lack of  IV access. Successful                        completion of the procedure was aided by having                        anesthesiology staff obtain IV access. The patient                        tolerated the procedure well. The quality of the bowel                        preparation was excellent. Findings:      The perianal and digital rectal examinations were normal.      Multiple small-mouthed diverticula were found in the sigmoid colon.      Non-bleeding internal hemorrhoids were found during retroflexion. The       hemorrhoids were medium-sized and Grade I (internal hemorrhoids that do       not prolapse).      The exam was otherwise without abnormality on direct and retroflexion       views. Impression:           - Diverticulosis in the sigmoid colon.                       - Non-bleeding internal hemorrhoids.                       - The examination was otherwise normal on direct and                        retroflexion views.                       - No specimens collected. Recommendation:       - Discharge patient to home (with escort).                       - Resume previous diet.                       - Continue present medications.                       - Repeat colonoscopy in 10 years for screening purposes. Wyline MoodKiran Isabellamarie Randa, MD Wyline MoodKiran Leena Tiede MD, MD 02/01/2017 8:33:52 AM This report has been signed electronically. Number of Addenda: 0 Note Initiated On: 02/01/2017 8:00 AM Scope Withdrawal Time: 0 hours 11 minutes 3 seconds  Total Procedure Duration: 0 hours 23 minutes 28 seconds       [facility]

## 2017-02-01 NOTE — Anesthesia Procedure Notes (Signed)
Date/Time: 02/01/2017 8:02 AM Performed by: Karoline CaldwellSTARR, Tyce Delcid Pre-anesthesia Checklist: Patient identified, Emergency Drugs available, Suction available and Patient being monitored Patient Re-evaluated:Patient Re-evaluated prior to induction Oxygen Delivery Method: Nasal cannula

## 2017-02-01 NOTE — Anesthesia Post-op Follow-up Note (Signed)
Anesthesia QCDR form completed.        

## 2017-02-01 NOTE — Transfer of Care (Signed)
Immediate Anesthesia Transfer of Care Note  Patient: Seth FrayCharles Alvin Saraceno Jr.  Procedure(s) Performed: Procedure(s): COLONOSCOPY WITH PROPOFOL (N/A)  Patient Location: PACU  Anesthesia Type:General  Level of Consciousness: awake, alert  and oriented  Airway & Oxygen Therapy: Patient Spontanous Breathing and Patient connected to nasal cannula oxygen  Post-op Assessment: Report given to RN  Post vital signs: Reviewed and stable  Last Vitals:  Vitals:   02/01/17 0836 02/01/17 0837  BP: 108/79 108/69  Pulse: 79 83  Resp: 18 16  Temp: (!) 35.9 C   SpO2: 97% 97%    Last Pain:  Vitals:   02/01/17 0836  TempSrc: Tympanic         Complications: No apparent anesthesia complications

## 2017-02-04 ENCOUNTER — Encounter: Payer: Self-pay | Admitting: Gastroenterology

## 2017-02-28 ENCOUNTER — Encounter: Payer: Self-pay | Admitting: Family Medicine

## 2017-02-28 ENCOUNTER — Ambulatory Visit (INDEPENDENT_AMBULATORY_CARE_PROVIDER_SITE_OTHER): Payer: BLUE CROSS/BLUE SHIELD | Admitting: Family Medicine

## 2017-02-28 ENCOUNTER — Ambulatory Visit
Admission: RE | Admit: 2017-02-28 | Discharge: 2017-02-28 | Disposition: A | Discharge status: Home / Self Care | Payer: BLUE CROSS/BLUE SHIELD | Source: Ambulatory Visit | Attending: Family Medicine | Admitting: Family Medicine

## 2017-02-28 VITALS — BP 110/72 | HR 76 | Temp 97.6°F | Resp 16 | Wt 215.0 lb

## 2017-02-28 DIAGNOSIS — M25511 Pain in right shoulder: Secondary | ICD-10-CM | POA: Insufficient documentation

## 2017-02-28 DIAGNOSIS — M129 Arthropathy, unspecified: Secondary | ICD-10-CM | POA: Diagnosis not present

## 2017-02-28 DIAGNOSIS — R937 Abnormal findings on diagnostic imaging of other parts of musculoskeletal system: Secondary | ICD-10-CM | POA: Diagnosis not present

## 2017-02-28 MED ORDER — NAPROXEN 500 MG PO TABS: 500.0000 mg | ORAL_TABLET | Freq: Two times a day (BID) | ORAL | 1 refills | Status: DC

## 2017-02-28 NOTE — Progress Notes (Signed)
Patient: Seth FrayCharles Alvin Afshar Jr. Male    DOB: 11/02/1965   51 y.o.   MRN: [id number] Visit Date: 02/28/2017  Today's Provider: Megan Mansichard Gilbert Jr, MD   Chief Complaint  Patient presents with  . Shoulder Pain   Subjective:    HPI Pt is here for right shoulder pain. It reports that it has been going on for about a month. He can move it a certain way and he feels the pain. He says it feels weaker and he can not a push up. He has no known injury but says that he does carry his computer bag for work on his right shoulder. No numbness of tingling in arm or hands.   Pt also reports that he always feels congested like he has to clear his throat all the time. He reports that Flonase does not work and the zyrtec D "jacks him up" and regular zyrtec does not work.      No Known Allergies   Current Outpatient Prescriptions:  .  cetirizine-pseudoephedrine (ZYRTEC-D) 5-120 MG tablet, Take 1 tablet by mouth daily as needed for allergies., Disp: , Rfl:  .  fluticasone (FLONASE) 50 MCG/ACT nasal spray, , Disp: , Rfl:  .  guaiFENesin (MUCINEX) 600 MG 12 hr tablet, Take 1 tablet (600 mg total) by mouth daily as needed. (Patient not taking: Reported on 12/03/2016), Disp: 1 tablet, Rfl: 0 .  loratadine (CLARITIN) 10 MG tablet, Take 1 tablet (10 mg total) by mouth daily. (Patient not taking: Reported on 12/03/2016), Disp: 30 tablet, Rfl: 11 .  omeprazole (PRILOSEC) 20 MG capsule, Take 1 capsule (20 mg total) by mouth daily. (Patient not taking: Reported on 12/03/2016), Disp: 30 capsule, Rfl: 12  Review of Systems  Constitutional: Negative.   HENT: Positive for congestion and postnasal drip.   Eyes: Negative.   Respiratory: Negative.   Cardiovascular: Negative.   Gastrointestinal: Negative.   Endocrine: Negative.   Genitourinary: Negative.   Musculoskeletal: Positive for arthralgias.  Skin: Negative.   Allergic/Immunologic: Negative.   Neurological: Negative.   Hematological: Negative.     Psychiatric/Behavioral: Negative.     Social History  Substance Use Topics  . Smoking status: Never Smoker  . Smokeless tobacco: Never Used  . Alcohol use No   Objective:   BP 110/72 (BP Location: Left Arm, Patient Position: Sitting, Cuff Size: Large)   Pulse 76   Temp 97.6 F (36.4 C) (Oral)   Resp 16   Wt 215 lb (97.5 kg)   BMI 29.99 kg/m  Vitals:   02/28/17 1502  BP: 110/72  Pulse: 76  Resp: 16  Temp: 97.6 F (36.4 C)  TempSrc: Oral  Weight: 215 lb (97.5 kg)     Physical Exam  Constitutional: He is oriented to person, place, and time. He appears well-developed and well-nourished.  HENT:  Head: Normocephalic and atraumatic.  Eyes: Conjunctivae are normal. No scleral icterus.  Neck: No thyromegaly present.  Cardiovascular: Normal rate, regular rhythm and normal heart sounds.   Pulmonary/Chest: Effort normal and breath sounds normal.  Abdominal: Soft.  Musculoskeletal: Normal range of motion. He exhibits no edema, tenderness or deformity.  FROM with discomfort in lateral deltoid.  Neurological: He is alert and oriented to person, place, and time.  Skin: Skin is warm and dry.  Psychiatric: He has a normal mood and affect. His behavior is normal. Judgment and thought content normal.        Assessment & Plan:  1. Arthropathy  - DG Shoulder Right; Future - naproxen (NAPROSYN) 500 MG tablet; Take 1 tablet (500 mg total) by mouth 2 (two) times daily with a meal.  Dispense: 60 tablet; Refill: 1  2. Right shoulder pain, unspecified chronicity  - DG Shoulder Right; Future - naproxen (NAPROSYN) 500 MG tablet; Take 1 tablet (500 mg total) by mouth 2 (two) times daily with a meal.  Dispense: 60 tablet; Refill: 1      I have done the exam and reviewed the above chart and it is accurate to the best of my knowledge. Dentist has been used in this note in any air is in the dictation or transcription are unintentional.  [name], MD  [facility], [facility]

## 2017-03-04 ENCOUNTER — Telehealth: Payer: Self-pay | Admitting: Family Medicine

## 2017-03-04 NOTE — Telephone Encounter (Signed)
pt would like a nurse to call him back regarding a message on mychart he got this morning but cant understand  Pt's call [phone]  Thanks teri

## 2017-03-04 NOTE — Telephone Encounter (Signed)
Spoke with patient and discussed xray results. Advised patient that is he is not doing any better in 2 weeks or longer to call us back and we can discuss other options. He has bone spur in his shoulder per xray. Patient advised to continue taking naproxen as directed on his last office visit on 02/28/17-Anastasiya V Hopkins, RMA

## 2017-03-31 DIAGNOSIS — Z23 Encounter for immunization: Secondary | ICD-10-CM | POA: Diagnosis not present

## 2017-04-17 ENCOUNTER — Telehealth: Payer: Self-pay | Admitting: *Deleted

## 2017-04-17 NOTE — Telephone Encounter (Signed)
Patient has been having pain in left arm for one week. Patient states pain radiates down to the palm of his hand. Left hand (palm) has numbness and tingling. Patient has no chest pain, back pain , neck pain, sob, or any other symptoms other than pain in arm. Patient scheduled appt with Maurine Ministerennis Chrismon 04/19/2017 at 2:30pm.

## 2017-04-19 ENCOUNTER — Ambulatory Visit (INDEPENDENT_AMBULATORY_CARE_PROVIDER_SITE_OTHER): Payer: BLUE CROSS/BLUE SHIELD | Admitting: Family Medicine

## 2017-04-19 ENCOUNTER — Encounter: Payer: Self-pay | Admitting: Family Medicine

## 2017-04-19 VITALS — BP 122/78 | HR 71 | Temp 97.9°F | Wt 217.0 lb

## 2017-04-19 DIAGNOSIS — M5412 Radiculopathy, cervical region: Secondary | ICD-10-CM

## 2017-04-19 DIAGNOSIS — M7541 Impingement syndrome of right shoulder: Secondary | ICD-10-CM

## 2017-04-19 NOTE — Patient Instructions (Addendum)
Start taking naprosyn twice a day everyday for at least the next week   Consider referral to orthopedist if shoulder is not much better in 2-3 weeks.     Shoulder Impingement Syndrome Shoulder impingement syndrome is a condition that causes pain when connective tissues (tendons) surrounding the shoulder joint become pinched. These tendons are part of the group of muscles and tissues that help to stabilize the shoulder (rotator cuff). Beneath the rotator cuff is a fluid-filled sac (bursa) that allows the muscles and tendons to glide smoothly. The bursa may become swollen or irritated (bursitis). Bursitis, swelling in the rotator cuff tendons, or both conditions can decrease how much space is under a bone in the shoulder joint (acromion), resulting in impingement. What are the causes? Shoulder impingement syndrome can be caused by bursitis or swelling of the rotator cuff tendons, which may result from:  Repetitive overhead arm movements.  Falling onto the shoulder.  Weakness in the shoulder muscles.  What increases the risk? You may be more likely to develop this condition if you are an athlete who participates in:  Sports that involve throwing, such as baseball.  Tennis.  Swimming.  Volleyball.  Some people are also more likely to develop impingement syndrome because of the shape of their acromion bone. What are the signs or symptoms? The main symptom of this condition is pain on the front or side of the shoulder. Pain may:  Get worse when lifting or raising the arm.  Get worse at night.  Wake you up from sleeping.  Feel sharp when the shoulder is moved, and then fade to an ache.  Other signs and symptoms may include:  Tenderness.  Stiffness.  Inability to raise the arm above shoulder level or behind the body.  Weakness.  How is this diagnosed? This condition may be diagnosed based on:  Your symptoms.  Your medical history.  A physical exam.  Imaging  tests, such as: ? X-rays. ? MRI. ? Ultrasound.  How is this treated? Treatment for this condition may include:  Resting your shoulder and avoiding all activities that cause pain or put stress on the shoulder.  Icing your shoulder.  NSAIDs to help reduce pain and swelling.  One or more injections of medicines to numb the area and reduce inflammation.  Physical therapy.  Surgery. This may be needed if nonsurgical treatments have not helped. Surgery may involve repairing the rotator cuff, reshaping the acromion, or removing the bursa.  Follow these instructions at home: Managing pain, stiffness, and swelling  If directed, apply ice to the injured area. ? Put ice in a plastic bag. ? Place a towel between your skin and the bag. ? Leave the ice on for 20 minutes, 2-3 times a day. Activity  Rest and return to your normal activities as told by your health care provider. Ask your health care provider what activities are safe for you.  Do exercises as told by your health care provider. General instructions  Do not use any tobacco products, including cigarettes, chewing tobacco, or e-cigarettes. Tobacco can delay healing. If you need help quitting, ask your health care provider.  Ask your health care provider when it is safe for you to drive.  Take over-the-counter and prescription medicines only as told by your health care provider.  Keep all follow-up visits as told by your health care provider. This is important. How is this prevented?  Give your body time to rest between periods of activity.  Be safe and  responsible while being active to avoid falls.  Maintain physical fitness, including strength and flexibility. Contact a health care provider if:  Your symptoms have not improved after 1-2 months of treatment and rest.  You cannot lift your arm away from your body. This information is not intended to replace advice given to you by your health care provider. Make sure you  discuss any questions you have with your health care provider. Document Released: 06/11/2005 Document Revised: 02/16/2016 Document Reviewed: 05/14/2015 Elsevier Interactive Patient Education  [employer].

## 2017-04-19 NOTE — Progress Notes (Signed)
   Patient: Seth FrayCharles Alvin Godinho Jr. Male    DOB: 10/19/1965   51 y.o.   MRN: [id number] Visit Date: 04/19/2017  Today's Provider: Mila Merryonald Fisher, MD   Chief Complaint  Patient presents with  . Hand Pain   Subjective:    Hand Pain   Incident onset: 2 weeks ago. There was no injury mechanism. The pain is present in the left hand. The quality of the pain is described as burning (tingling). Radiates to: Burning sensation up left arm at times. The pain has been intermittent since the incident. Associated symptoms include numbness (tingling) and tingling. The symptoms are aggravated by movement. He has tried nothing for the symptoms.  Is worse when he stretches out right arm No wrist pain. Has not taken any OTC pain relievers.     Previous Medications   CETIRIZINE-PSEUDOEPHEDRINE (ZYRTEC-D) 5-120 MG TABLET    Take 1 tablet by mouth daily as needed for allergies.   FLUTICASONE (FLONASE) 50 MCG/ACT NASAL SPRAY       GUAIFENESIN (MUCINEX) 600 MG 12 HR TABLET    Take 1 tablet (600 mg total) by mouth daily as needed.   OMEPRAZOLE (PRILOSEC) 20 MG CAPSULE    Take 1 capsule (20 mg total) by mouth daily.    Review of Systems  Constitutional: Negative.   Respiratory: Negative.   Cardiovascular: Negative.   Musculoskeletal:       Hand pain  Neurological: Positive for tingling and numbness (tingling).    Social History  Substance Use Topics  . Smoking status: Never Smoker  . Smokeless tobacco: Never Used  . Alcohol use No   Objective:   BP 122/78 (BP Location: Right Arm, Patient Position: Sitting, Cuff Size: Normal)   Pulse 71   Temp 97.9 F (36.6 C) (Oral)   Wt 217 lb (98.4 kg)   SpO2 98%   BMI 30.27 kg/m   Physical Exam  General appearance: alert, well developed, well nourished, cooperative and in no distress Head: Normocephalic, without obvious abnormality, atraumatic Respiratory: Respirations even and unlabored, normal respiratory rate Extremities: Slight tenderness just distal  to left axilla. FROM neck,shoulder, arms, and wrists. No other tenderness of left upper extremity. No gross deformities. Negative tinel's and phalen's sign. No cervical spine tenderness. .      Assessment & Plan:     1. Brachial plexus neuralgia He still has some naproxen prescribed by Dr. Sullivan LoneGilbert for his shoulder earlier this month. He is going to start back on scheduled doses for the next 10-14 days. Consider neurology referral if not much better during that period of time.   2. Impingement syndrome of right shoulder Noted along with spur on recent shoulder Xray. He is going to see if taking scheduled doses of naprosyn help. If not he will call back for orthopedic referral.

## 2017-04-20 DIAGNOSIS — M7541 Impingement syndrome of right shoulder: Secondary | ICD-10-CM | POA: Insufficient documentation

## 2017-11-14 ENCOUNTER — Ambulatory Visit: Payer: BLUE CROSS/BLUE SHIELD | Admitting: Family Medicine

## 2017-11-14 VITALS — BP 138/92 | HR 78 | Temp 98.0°F | Resp 16 | Wt 219.0 lb

## 2017-11-14 DIAGNOSIS — M75101 Unspecified rotator cuff tear or rupture of right shoulder, not specified as traumatic: Secondary | ICD-10-CM | POA: Diagnosis not present

## 2017-11-14 MED ORDER — NAPROXEN 500 MG PO TABS: 500.0000 mg | ORAL_TABLET | Freq: Two times a day (BID) | ORAL | 2 refills | Status: DC

## 2017-11-14 NOTE — Progress Notes (Signed)
Paralee Cancel Janalyn Shy.  MRN: [id number] DOB: 07-11-1965  Subjective:  HPI  Patient is a 52 year old male who presents with complaint of right shoulder pain.  He states he has had this before and has had x-rays done.  He does not remember what the x-rays revealed.  The patient staes he is unable to put any pressure (as in push up) on the shoulder.  Patient Active Problem List   Diagnosis Date Noted  . Impingement syndrome of right shoulder 04/20/2017  . Arthritis 11/16/2015  . 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 Hemorrhoids, internal 11/16/2015  . Disorder of prostate 11/16/2015  . Avitaminosis D 04/26/2009  . Blood in the urine 11/18/2007  . 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  Social History   Socioeconomic History  . Marital status: Married    Spouse name: Not on file  . Number of children: Not on file  . Years of education: Not on file  . Highest education level: Not on file  Occupational History  . Not on file  Social Needs  . Financial resource strain: Not on file  . Food insecurity:    Worry: Not on file    Inability: Not on file  . Transportation needs:    Medical: Not on file    Non-medical: Not on file  Tobacco Use  . Smoking status: Never Smoker  . Smokeless tobacco: Never Used  Substance and Sexual Activity  . Alcohol use: No  . Drug use: No  . Sexual activity: Not on file  Lifestyle  . Physical activity:    Days per week: Not on file    Minutes per session: Not on file  . Stress: Not on file  Relationships  . Social connections:    Talks on phone: Not on file    Gets together: Not on file    Attends religious service: Not on file    Active member of club or organization: Not on file    Attends meetings of clubs or organizations: Not on file    Relationship status: Not on file  . Intimate partner violence:    Fear of current or ex partner: Not on file    Emotionally abused: Not on file    Physically abused: Not on  file    Forced sexual activity: Not on file  Other Topics Concern  . Not on file  Social History Narrative  . Not on file    Outpatient Encounter Medications as of 11/14/2017  Medication Sig Note  . cetirizine-pseudoephedrine (ZYRTEC-D) 5-120 MG tablet Take 1 tablet by mouth daily as needed for allergies.   . fluticasone (FLONASE) 50 MCG/ACT nasal spray  12/01/2015: Received from: [facility]  . [DISCONTINUED] guaiFENesin (MUCINEX) 600 MG 12 hr tablet Take 1 tablet (600 mg total) by mouth daily as needed. (Patient not taking: Reported on 04/19/2017)   . [DISCONTINUED] omeprazole (PRILOSEC) 20 MG capsule Take 1 capsule (20 mg total) by mouth daily.    No facility-administered encounter medications on file as of 11/14/2017.     No Known Allergies  Review of Systems  Constitutional: Negative.   Respiratory: Negative.   Cardiovascular: Negative.   Musculoskeletal: Positive for joint pain (right shoulder pain). Negative for back pain, myalgias and neck pain.  Neurological: Positive for tingling (tingling in left hand).  Psychiatric/Behavioral: Negative.     Objective:  BP (!) 138/92 (BP Location: Right Arm, Patient Position: Sitting, Cuff Size: Normal)  Pulse 78   Temp 98 F (36.7 C) (Oral)   Resp 16   Wt 219 lb (99.3 kg)   BMI 30.54 kg/m   Physical Exam  Constitutional: He is oriented to person, place, and time and well-developed, well-nourished, and in no distress.  HENT:  Head: Normocephalic and atraumatic.  Eyes: Conjunctivae are normal. No scleral icterus.  Neck: No thyromegaly present.  Cardiovascular: Normal rate, regular rhythm and normal heart sounds.  Pulmonary/Chest: Effort normal and breath sounds normal.  Abdominal: Soft.  Neurological: He is alert and oriented to person, place, and time. Gait normal. GCS score is 15.  Skin: Skin is warm and dry.  Psychiatric: Mood, memory, affect and judgment normal.    Assessment and Plan :   1. Rotator  cuff syndrome of right shoulder2 - naproxen (NAPROSYN) 500 MG tablet; Take 1 tablet (500 mg total) by mouth 2 (two) times daily with a meal.  Dispense: 60 tablet; Refill: 2 - Ambulatory referral to Orthopedic Surgery  I have done the exam and reviewed the chart and it is accurate to the best of my knowledge. Dentist has been used and  any errors in dictation or transcription are unintentional. Julieanne Manson M.D. [facility]   - naproxen (NAPROSYN) 500 MG tablet; Take 1 tablet (500 mg total) by mouth 2 (two) times daily with a meal.  Dispense: 60 tablet; Refill: 2 - Ambulatory referral to Orthopedic Surgery

## 2017-11-21 DIAGNOSIS — M7541 Impingement syndrome of right shoulder: Secondary | ICD-10-CM | POA: Diagnosis not present

## 2017-12-04 ENCOUNTER — Encounter: Payer: Self-pay | Admitting: Family Medicine

## 2017-12-24 ENCOUNTER — Encounter: Payer: Self-pay | Admitting: Family Medicine

## 2017-12-24 ENCOUNTER — Ambulatory Visit (INDEPENDENT_AMBULATORY_CARE_PROVIDER_SITE_OTHER): Payer: BLUE CROSS/BLUE SHIELD | Admitting: Family Medicine

## 2017-12-24 VITALS — BP 120/72 | HR 70 | Temp 98.4°F | Resp 16 | Ht 72.0 in | Wt 213.0 lb

## 2017-12-24 DIAGNOSIS — M75101 Unspecified rotator cuff tear or rupture of right shoulder, not specified as traumatic: Secondary | ICD-10-CM

## 2017-12-24 DIAGNOSIS — Z0000 Encounter for general adult medical examination without abnormal findings: Secondary | ICD-10-CM

## 2017-12-24 DIAGNOSIS — Z125 Encounter for screening for malignant neoplasm of prostate: Secondary | ICD-10-CM

## 2017-12-24 LAB — POCT URINALYSIS DIPSTICK
Bilirubin, UA: NEGATIVE
GLUCOSE UA: NEGATIVE
Ketones, UA: NEGATIVE
Leukocytes, UA: NEGATIVE
Nitrite, UA: NEGATIVE
Protein, UA: NEGATIVE
SPEC GRAV UA: 1.015 (ref 1.010–1.025)
Urobilinogen, UA: 0.2 E.U./dL
pH, UA: 6 (ref 5.0–8.0)

## 2017-12-24 NOTE — Progress Notes (Signed)
Patient: Seth Welty., Male    DOB: [birth year], 52 y.o.   MRN: [id number] Visit Date: 12/24/2017  Today's Provider: [name], MD   Chief Complaint  Patient presents with  . Annual Exam   Subjective:    Annual physical exam Seth Pietsch. is a 52 y.o. male who presents today for health maintenance and complete physical. He feels well. He reports exercising occasionally. He reports he is sleeping well.  Colonoscopy- 02/01/2017. Diverticulosis. Repeat in 10 years.   Immunization History  Administered Date(s) Administered  . Influenza Inj Mdck Quad Pf 03/31/2017  . Influenza-Unspecified 03/31/2017  . Td 12/03/2016  . Tdap 10/16/2006     Review of Systems  Constitutional: Negative for activity change, appetite change, chills, diaphoresis, fatigue, fever and unexpected weight change.  HENT: Negative.   Eyes: Negative.   Respiratory: Negative for apnea, cough, choking, chest tightness, shortness of breath, wheezing and stridor.   Cardiovascular: Negative.   Gastrointestinal: Negative.   Endocrine: Negative.   Genitourinary: Negative.   Musculoskeletal: Positive for arthralgias and back pain.  Skin: Negative.   Allergic/Immunologic: Negative.   Neurological: Negative for dizziness, light-headedness and headaches.  Hematological: Negative for adenopathy. Does not bruise/bleed easily.  Psychiatric/Behavioral: Negative for agitation, behavioral problems, confusion, decreased concentration, dysphoric mood, self-injury, sleep disturbance and suicidal ideas. The patient is not nervous/anxious and is not hyperactive.     Social History      He  reports that he has never smoked. He has never used smokeless tobacco. He reports that he does not drink alcohol or use drugs.       Social History   Socioeconomic History  . Marital status: Married    Spouse name: Not on file  . Number of children: Not on file  . Years of education: Not on file  .  Highest education level: Not on file  Occupational History  . Not on file  Social Needs  . Financial resource strain: Not on file  . Food insecurity:    Worry: Not on file    Inability: Not on file  . Transportation needs:    Medical: Not on file    Non-medical: Not on file  Tobacco Use  . Smoking status: Never Smoker  . Smokeless tobacco: Never Used  Substance and Sexual Activity  . Alcohol use: No  . Drug use: No  . Sexual activity: Not on file  Lifestyle  . Physical activity:    Days per week: Not on file    Minutes per session: Not on file  . Stress: Not on file  Relationships  . Social connections:    Talks on phone: Not on file    Gets together: Not on file    Attends religious service: Not on file    Active member of club or organization: Not on file    Attends meetings of clubs or organizations: Not on file    Relationship status: Not on file  Other Topics Concern  . Not on file  Social History Narrative  . Not on file    No past medical history on file.   Patient Active Problem List   Diagnosis Date Noted  . Impingement syndrome of right shoulder 04/20/2017  . Arthritis 11/16/2015  . 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 Hemorrhoids, internal 11/16/2015  . Disorder of prostate 11/16/2015  . Avitaminosis D 04/26/2009  . Blood in the urine 11/18/2007  . Coccygeal fistula 02/04/2007  Past Surgical History:  Procedure Laterality Date  . APPENDECTOMY  2004  . COLONOSCOPY WITH PROPOFOL N/A 02/01/2017   Procedure: COLONOSCOPY WITH PROPOFOL;  Surgeon: Wyline MoodAnna, Kiran, MD;  [facility];  Service: Endoscopy;  Laterality: N/A;    Family History        Family Status  Relation Name Status  . Mother  Alive  . Father  Alive  . Sister  Alive  . Sister  Alive        His family history includes COPD in his father; Cancer in his father.      No Known Allergies   Current Outpatient Medications:  .  cetirizine-pseudoephedrine (ZYRTEC-D) 5-120 MG  tablet, Take 1 tablet by mouth daily as needed for allergies., Disp: , Rfl:  .  fluticasone (FLONASE) 50 MCG/ACT nasal spray, , Disp: , Rfl:  .  naproxen (NAPROSYN) 500 MG tablet, Take 1 tablet (500 mg total) by mouth 2 (two) times daily with a meal., Disp: 60 tablet, Rfl: 2   Patient Care Team: Maple HudsonGilbert, Richard L Jr., MD as PCP - General (Family Medicine)      Objective:   Vitals: BP 120/72 (BP Location: Right Arm, Patient Position: Sitting, Cuff Size: Normal)   Pulse 70   Temp 98.4 F (36.9 C)   Resp 16   Ht 6' (1.829 m)   Wt 213 lb (96.6 kg)   SpO2 98%   BMI 28.89 kg/m    Vitals:   12/24/17 1013  BP: 120/72  Pulse: 70  Resp: 16  Temp: 98.4 F (36.9 C)  SpO2: 98%  Weight: 213 lb (96.6 kg)  Height: 6' (1.829 m)     Physical Exam  Constitutional: He is oriented to person, place, and time. He appears well-developed and well-nourished.  HENT:  Head: Normocephalic and atraumatic.  Right Ear: External ear normal.  Left Ear: External ear normal.  Nose: Nose normal.  Mouth/Throat: Oropharynx is clear and moist.  Eyes: Conjunctivae are normal. No scleral icterus.  Neck: No thyromegaly present.  Cardiovascular: Normal rate, regular rhythm, normal heart sounds and intact distal pulses.  Pulmonary/Chest: Effort normal and breath sounds normal.  Abdominal: Soft.  Genitourinary: Penis normal.  Musculoskeletal: He exhibits no edema.  Neurological: He is alert and oriented to person, place, and time.  Skin: Skin is warm and dry.  Psychiatric: He has a normal mood and affect. His behavior is normal. Judgment and thought content normal.     Depression Screen PHQ 2/9 Scores 12/24/2017 12/03/2016 12/01/2015  PHQ - 2 Score 2 0 0  PHQ- 9 Score 7 2 -      Assessment & Plan:     Routine Health Maintenance and Physical Exam  Exercise Activities and Dietary recommendations Goals    None      Immunization History  Administered Date(s) Administered  . Influenza Inj Mdck  Quad Pf 03/31/2017  . Influenza-Unspecified 03/31/2017  . Td 12/03/2016  . Tdap 10/16/2006    Health Maintenance  Topic Date Due  . HIV Screening  08/31/1981  . INFLUENZA VACCINE  01/23/2018  . TETANUS/TDAP  12/04/2026  . COLONOSCOPY  02/02/2027     Discussed health benefits of physical activity, and encouraged him to engage in regular exercise appropriate for his age and condition.    --------------------------------------------------------------------   I have done the exam and reviewed the above chart and it is accurate to the best of my knowledge. [employer]  technology has been used in this note in any air  is in the dictation or transcription are unintentional.  Wilhemena Durie, MD  [facility]

## 2017-12-25 LAB — COMPREHENSIVE METABOLIC PANEL
A/G RATIO: 1.7 (ref 1.2–2.2)
ALBUMIN: 4.5 g/dL (ref 3.5–5.5)
ALT: 13 IU/L (ref 0–44)
AST: 15 IU/L (ref 0–40)
Alkaline Phosphatase: 74 IU/L (ref 39–117)
BUN / CREAT RATIO: 10 (ref 9–20)
BUN: 10 mg/dL (ref 6–24)
Bilirubin Total: 0.5 mg/dL (ref 0.0–1.2)
CO2: 25 mmol/L (ref 20–29)
Calcium: 9.1 mg/dL (ref 8.7–10.2)
Chloride: 103 mmol/L (ref 96–106)
Creatinine, Ser: 1.03 mg/dL (ref 0.76–1.27)
GFR, EST AFRICAN AMERICAN: 97 mL/min/{1.73_m2} (ref >59)
GFR, EST NON AFRICAN AMERICAN: 84 mL/min/{1.73_m2} (ref >59)
Globulin, Total: 2.6 g/dL (ref 1.5–4.5)
Glucose: 98 mg/dL (ref 65–99)
POTASSIUM: 4 mmol/L (ref 3.5–5.2)
Sodium: 141 mmol/L (ref 134–144)
TOTAL PROTEIN: 7.1 g/dL (ref 6.0–8.5)

## 2017-12-25 LAB — TSH: TSH: 3.01 u[IU]/mL (ref 0.450–4.500)

## 2017-12-25 LAB — LIPID PANEL
CHOL/HDL RATIO: 5.5 ratio — AB (ref 0.0–5.0)
Cholesterol, Total: 220 mg/dL — ABNORMAL HIGH (ref 100–199)
HDL: 40 mg/dL (ref >39)
LDL Calculated: 157 mg/dL — ABNORMAL HIGH (ref 0–99)
Triglycerides: 114 mg/dL (ref 0–149)
VLDL CHOLESTEROL CAL: 23 mg/dL (ref 5–40)

## 2017-12-25 LAB — CBC WITH DIFFERENTIAL/PLATELET
BASOS: 0 %
Basophils Absolute: 0 10*3/uL (ref 0.0–0.2)
EOS (ABSOLUTE): 0.1 10*3/uL (ref 0.0–0.4)
EOS: 1 %
HEMATOCRIT: 40.1 % (ref 37.5–51.0)
HEMOGLOBIN: 13.7 g/dL (ref 13.0–17.7)
IMMATURE GRANS (ABS): 0 10*3/uL (ref 0.0–0.1)
Immature Granulocytes: 0 %
Lymphocytes Absolute: 2 10*3/uL (ref 0.7–3.1)
Lymphs: 39 %
MCH: 26.7 pg (ref 26.6–33.0)
MCHC: 34.2 g/dL (ref 31.5–35.7)
MCV: 78 fL — AB (ref 79–97)
MONOCYTES: 8 %
Monocytes Absolute: 0.4 10*3/uL (ref 0.1–0.9)
NEUTROS ABS: 2.6 10*3/uL (ref 1.4–7.0)
Neutrophils: 52 %
Platelets: 217 10*3/uL (ref 150–450)
RBC: 5.14 x10E6/uL (ref 4.14–5.80)
RDW: 14.3 % (ref 12.3–15.4)
WBC: 5.1 10*3/uL (ref 3.4–10.8)

## 2017-12-25 LAB — PSA: PROSTATE SPECIFIC AG, SERUM: 0.6 ng/mL (ref 0.0–4.0)

## 2018-10-25 IMAGING — CR DG SHOULDER 2+V*R*
1 series · 3 of 3 positions shown · non-contrast
Comparison: Chest x-ray of July 25, 2010 which included portions
of the left shoulder.

CLINICAL DATA: One month of right shoulder pain with no known
injury.

EXAM:
RIGHT SHOULDER - 2+ VIEW

[Series 1: dg shoulder right · 0.14mm/px · 3 of 3 slices shown]
[im 1/3]
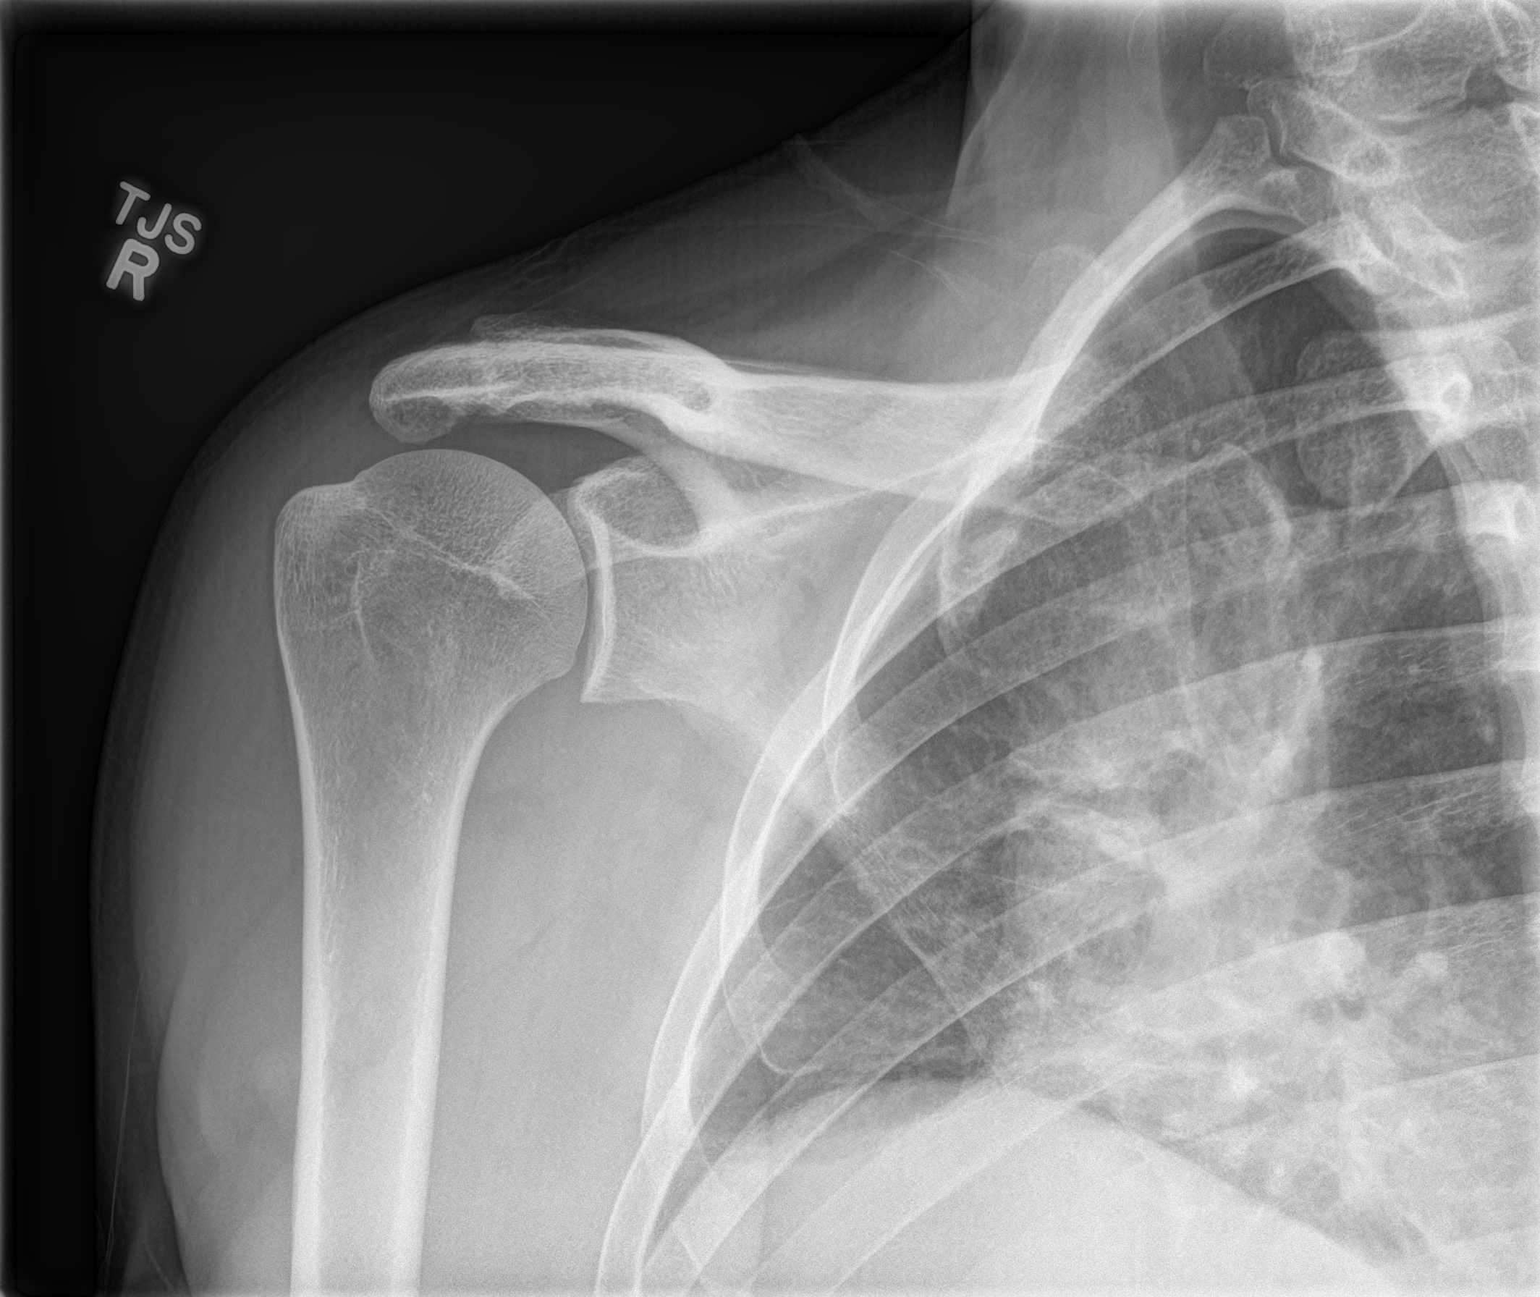
[im 2/3]
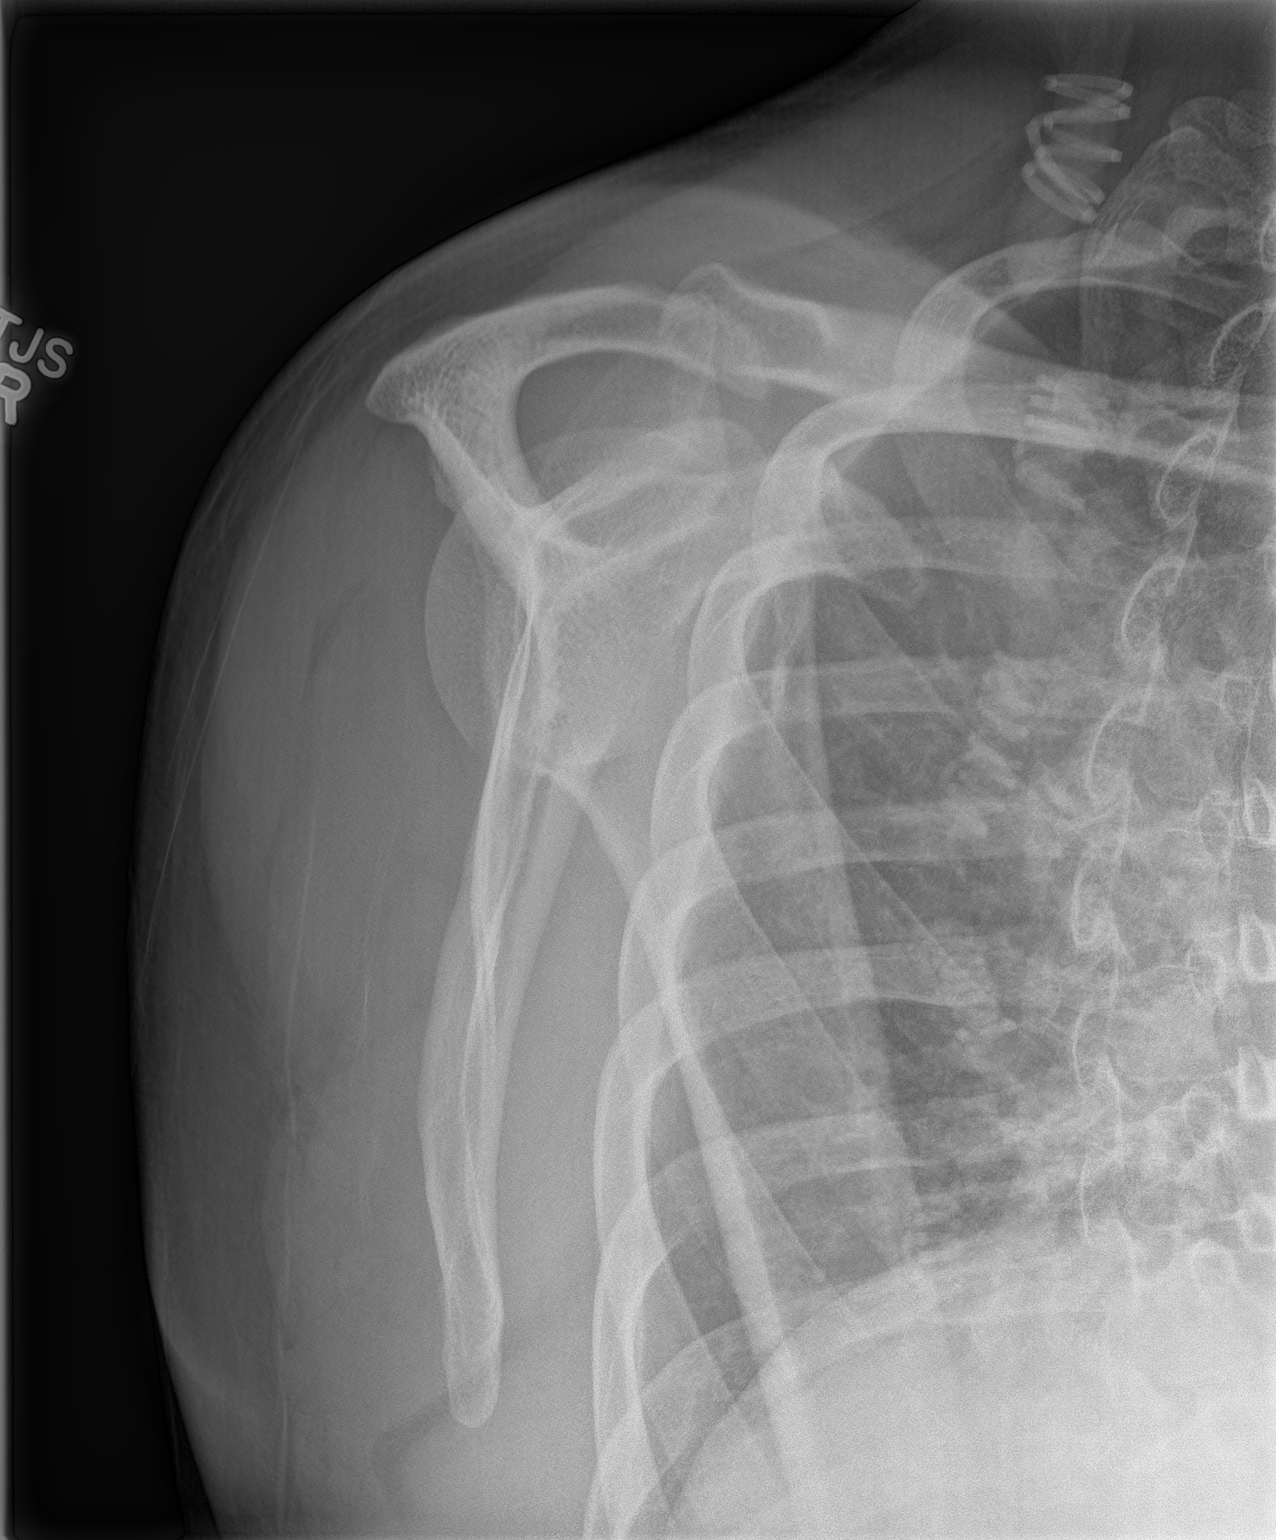
[im 3/3]
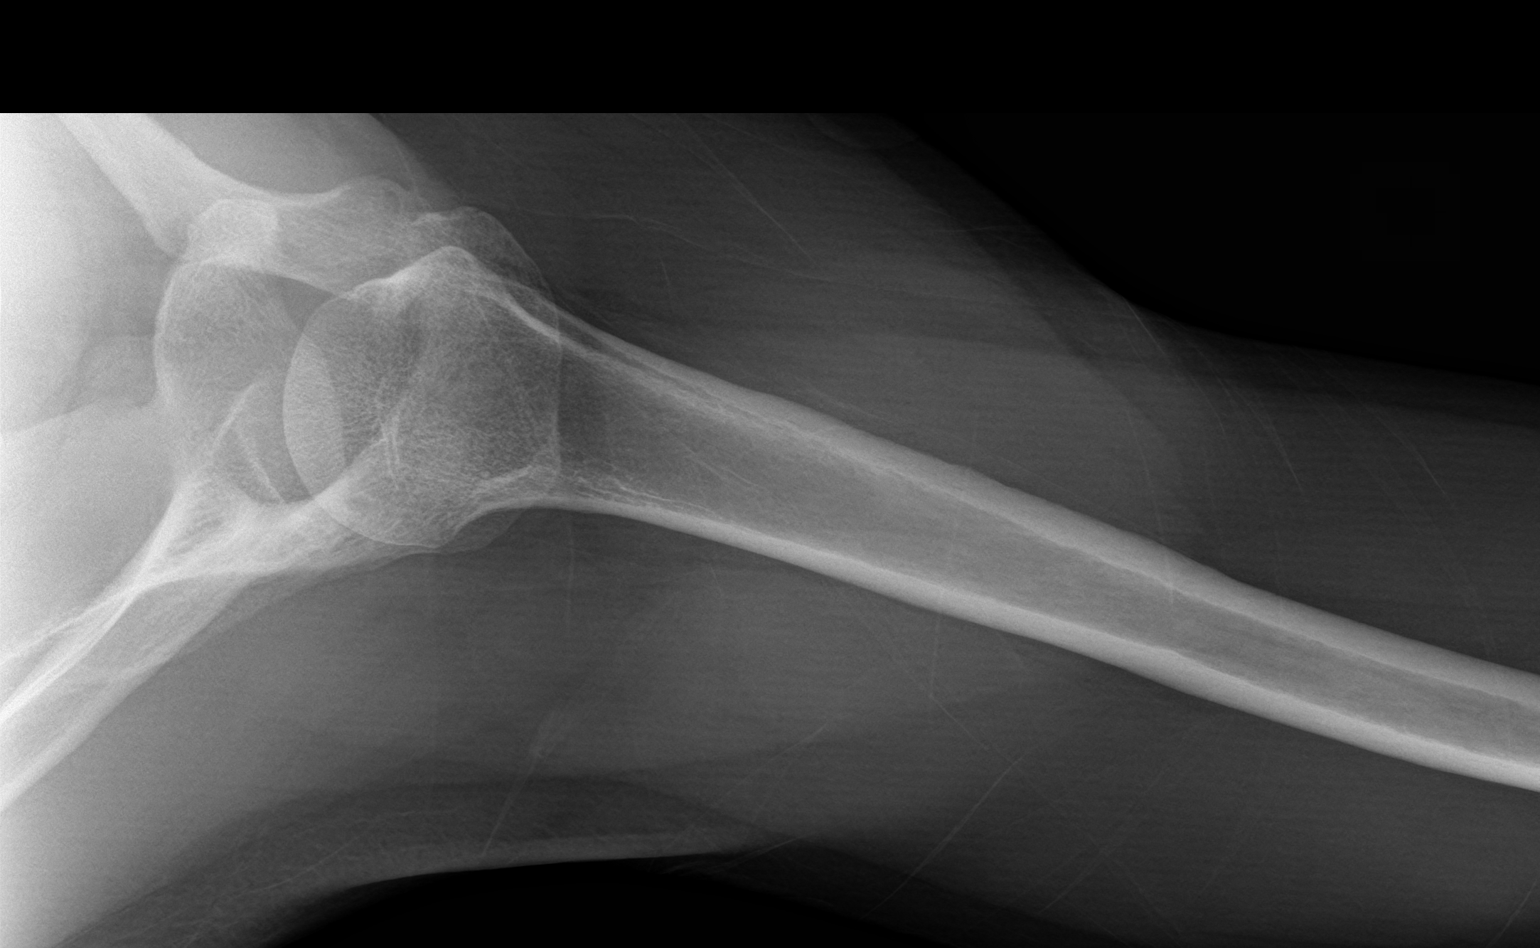

[3 of 3 positions shown; findings below may reference images not displayed]

FINDINGS: The bones are subjectively adequately mineralized. The glenohumeral
joint spaces reasonably well-maintained. There is mild narrowing of
the subacromial subdeltoid space. A small spur arises from the
inferior articular margin of the distal clavicle. The AC joint space
is reasonably well-maintained.
IMPRESSION: There is no acute bony abnormality of the right shoulder. A mild
impingement syndrome involving the rotator cuff may be present given
the small spur that arises from the undersurface of the distal
clavicle.

## 2019-01-05 ENCOUNTER — Encounter: Payer: Self-pay | Admitting: Family Medicine

## 2019-02-19 ENCOUNTER — Encounter: Payer: Self-pay | Admitting: Family Medicine

## 2019-02-19 ENCOUNTER — Other Ambulatory Visit: Payer: Self-pay

## 2019-02-19 ENCOUNTER — Ambulatory Visit: Payer: BLUE CROSS/BLUE SHIELD | Admitting: Family Medicine

## 2019-02-19 VITALS — BP 121/80 | HR 71 | Temp 97.1°F | Wt 209.0 lb

## 2019-02-19 DIAGNOSIS — R05 Cough: Secondary | ICD-10-CM

## 2019-02-19 DIAGNOSIS — S39012A Strain of muscle, fascia and tendon of lower back, initial encounter: Secondary | ICD-10-CM

## 2019-02-19 DIAGNOSIS — R058 Other specified cough: Secondary | ICD-10-CM

## 2019-02-19 MED ORDER — CYCLOBENZAPRINE HCL 10 MG PO TABS: 10.0000 mg | ORAL_TABLET | Freq: Three times a day (TID) | ORAL | 0 refills | Status: DC | PRN

## 2019-02-19 MED ORDER — PREDNISONE 10 MG (21) PO TBPK: ORAL_TABLET | ORAL | 0 refills | Status: DC

## 2019-02-19 NOTE — Progress Notes (Signed)
Patient: Seth Graham. Male    DOB: 07-Feb-1966   53 y.o.   MRN: [id number] Visit Date: 02/19/2019  Today's Provider: Wilhemena Durie, MD   Chief Complaint  Patient presents with  . Back Pain    Lower back Started 02/15/2019   Subjective:     Back Pain This is a new problem. The current episode started in the past 7 days. The problem has been gradually worsening (Especially since last night.) since onset. The pain is present in the lumbar spine. The quality of the pain is described as shooting. The pain radiates to the right thigh. The symptoms are aggravated by position and twisting. Associated symptoms include leg pain. Pertinent negatives include no abdominal pain, bladder incontinence, bowel incontinence, headaches, numbness, paresthesias, tingling or weakness.  No radicular symptoms.  No Known Allergies   Current Outpatient Medications:  .  cetirizine-pseudoephedrine (ZYRTEC-D) 5-120 MG tablet, Take 1 tablet by mouth daily as needed for allergies., Disp: , Rfl:  .  fluticasone (FLONASE) 50 MCG/ACT nasal spray, , Disp: , Rfl:  .  naproxen (NAPROSYN) 500 MG tablet, Take 1 tablet (500 mg total) by mouth 2 (two) times daily with a meal. (Patient not taking: Reported on 02/19/2019), Disp: 60 tablet, Rfl: 2  Review of Systems  Constitutional: Negative.   Respiratory: Negative.   Cardiovascular: Negative.   Gastrointestinal: Negative.  Negative for abdominal pain and bowel incontinence.  Genitourinary: Negative.  Negative for bladder incontinence.  Musculoskeletal: Positive for back pain and gait problem. Negative for arthralgias, joint swelling, myalgias, neck pain and neck stiffness.  Neurological: Negative for dizziness, tingling, weakness, light-headedness, numbness, headaches and paresthesias.  Psychiatric/Behavioral: Negative.   Patient is cough which is aided by as needed Robitussin  Social History   Tobacco Use  . Smoking status: Never Smoker  .  Smokeless tobacco: Never Used  Substance Use Topics  . Alcohol use: No      Objective:   BP 121/80 (BP Location: Right Arm, Patient Position: Sitting, Cuff Size: Normal)   Pulse 71   Temp (!) 97.1 F (36.2 C) (Temporal)   Wt 209 lb (94.8 kg)   BMI 28.35 kg/m  Vitals:   02/19/19 1104  BP: 121/80  Pulse: 71  Temp: (!) 97.1 F (36.2 C)  TempSrc: Temporal  Weight: 209 lb (94.8 kg)     Physical Exam Constitutional:      Appearance: He is well-developed.  HENT:     Head: Normocephalic and atraumatic.     Right Ear: External ear normal.     Left Ear: External ear normal.     Nose: Nose normal.  Eyes:     General: No scleral icterus.    Conjunctiva/sclera: Conjunctivae normal.  Neck:     Thyroid: No thyromegaly.  Cardiovascular:     Rate and Rhythm: Normal rate and regular rhythm.     Heart sounds: Normal heart sounds.  Pulmonary:     Effort: Pulmonary effort is normal.     Breath sounds: Normal breath sounds.  Abdominal:     Palpations: Abdomen is soft.  Musculoskeletal:     Comments: He has mild pain in the lower back with twisting or with flexion.  No radicular symptoms.  Strength is normal.  Straight leg raise is negative.  Skin:    General: Skin is warm and dry.  Neurological:     General: No focal deficit present.     Mental Status: He is alert  and oriented to person, place, and time.  Psychiatric:        Behavior: Behavior normal.        Thought Content: Thought content normal.        Judgment: Judgment normal.      No results found for any visits on 02/19/19.     Assessment & Plan    1. Strain of lumbar region, initial encounter RTC if not improving for acute strain. - cyclobenzaprine (FLEXERIL) 10 MG tablet; Take 1 tablet (10 mg total) by mouth 3 (three) times daily as needed for muscle spasms.  Dispense: 30 tablet; Refill: 0 - predniSONE (STERAPRED UNI-PAK 21 TAB) 10 MG (21) TBPK tablet; Taper as directed.  Dispense: 21 tablet; Refill: 0 2.Cough  Guaifenisin helps.    Richard Wendelyn BreslowGilbert Jr, MD  [facility]

## 2019-02-25 ENCOUNTER — Ambulatory Visit: Payer: Self-pay | Admitting: Family Medicine

## 2019-04-16 ENCOUNTER — Ambulatory Visit: Payer: BLUE CROSS/BLUE SHIELD | Admitting: Family Medicine

## 2019-04-16 ENCOUNTER — Other Ambulatory Visit: Payer: Self-pay

## 2019-04-16 ENCOUNTER — Encounter: Payer: Self-pay | Admitting: Family Medicine

## 2019-04-16 VITALS — BP 122/70 | HR 97 | Temp 96.8°F | Wt 203.0 lb

## 2019-04-16 DIAGNOSIS — S20212A Contusion of left front wall of thorax, initial encounter: Secondary | ICD-10-CM | POA: Diagnosis not present

## 2019-04-16 NOTE — Progress Notes (Signed)
Seth Graham.  MRN: [id number] DOB: September 21, 1965  Subjective:  HPI   The patient is a 21 year ld male who presents for evaluation of left rib area pain.  The patient states that 5 days ago he was throwing something into the dumpster and fell between the dumpster and concrete pad.  When he fell he landed with his rib area hitting.  The patient states the pain is constant but most significant with movement.  He states it has not caused any difficulty with breathing and he has not been short of breath.  Patient Active Problem List   Diagnosis Date Noted  . Impingement syndrome of right shoulder 04/20/2017  . Arthritis 11/16/2015  . 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 Hemorrhoids, internal 11/16/2015  . Disorder of prostate 11/16/2015  . Avitaminosis D 04/26/2009  . Blood in the urine 11/18/2007  . Coccygeal fistula 02/04/2007   No past medical history on file.  Past Surgical History:  Procedure Laterality Date  . APPENDECTOMY  2004  . COLONOSCOPY WITH PROPOFOL N/A 02/01/2017   Procedure: COLONOSCOPY WITH PROPOFOL;  Surgeon: Jonathon Bellows, MD;  Location[facility] ENDOSCOPY;  Service: Endoscopy;  Laterality: N/A;   Family History  Problem Relation Age of Onset  . COPD Father   . Cancer Father        prostate and colorectal adenoma   Social History   Socioeconomic History  . Marital status: Married    Spouse name: Not on file  . Number of children: Not on file  . Years of education: Not on file  . Highest education level: Not on file  Occupational History  . Not on file  Social Needs  . Financial resource strain: Not on file  . Food insecurity    Worry: Not on file    Inability: Not on file  . Transportation needs    Medical: Not on file    Non-medical: Not on file  Tobacco Use  . Smoking status: Never Smoker  . Smokeless tobacco: Never Used  Substance and Sexual Activity  . Alcohol use: No  . Drug use: No  . Sexual activity: Not on file  Lifestyle  . Physical  activity    Days per week: Not on file    Minutes per session: Not on file  . Stress: Not on file  Relationships  . Social Herbalist on phone: Not on file    Gets together: Not on file    Attends religious service: Not on file    Active member of club or organization: Not on file    Attends meetings of clubs or organizations: Not on file    Relationship status: Not on file  . Intimate partner violence    Fear of current or ex partner: Not on file    Emotionally abused: Not on file    Physically abused: Not on file    Forced sexual activity: Not on file  Other Topics Concern  . Not on file  Social History Narrative  . Not on file   Outpatient Encounter Medications as of 04/16/2019  Medication Sig Note  . cetirizine-pseudoephedrine (ZYRTEC-D) 5-120 MG tablet Take 1 tablet by mouth daily as needed for allergies.   . cyclobenzaprine (FLEXERIL) 10 MG tablet Take 1 tablet (10 mg total) by mouth 3 (three) times daily as needed for muscle spasms.   . fluticasone (FLONASE) 50 MCG/ACT nasal spray  12/01/2015: Received from: [facility]  . predniSONE (STERAPRED  UNI-PAK 21 TAB) 10 MG (21) TBPK tablet Taper as directed.   . naproxen (NAPROSYN) 500 MG tablet Take 1 tablet (500 mg total) by mouth 2 (two) times daily with a meal. (Patient not taking: Reported on 02/19/2019)    No facility-administered encounter medications on file as of 04/16/2019.    No Known Allergies  Review of Systems  Constitutional: Negative for chills, diaphoresis, fever and malaise/fatigue.  HENT: Negative for congestion, ear pain, sinus pain, sore throat and tinnitus.   Respiratory: Negative for cough and shortness of breath.   Cardiovascular: Negative for chest pain.  Gastrointestinal: Negative for abdominal pain and vomiting.  Musculoskeletal: Positive for falls and myalgias.  Neurological: Negative for dizziness and headaches.    Objective:  BP 122/70 (BP Location: Right Arm, Patient  Position: Sitting, Cuff Size: Normal)   Pulse 97   Temp (!) 96.8 F (36 C) (Skin)   Wt 203 lb (92.1 kg)   SpO2 96%   BMI 27.53 kg/m   Physical Exam  Constitutional: He is oriented to person, place, and time and well-developed, well-nourished, and in no distress.  HENT:  Head: Normocephalic.  Eyes: Conjunctivae are normal.  Neck: Neck supple.  Cardiovascular: Normal rate.  Pulmonary/Chest: Effort normal and breath sounds normal.  Slight soreness in the left lower ribs anteriorly with slight bruising. No crepitus or acute pain with rib movement.  Abdominal: Soft. Bowel sounds are normal.  Musculoskeletal: Normal range of motion.  Neurological: He is alert and oriented to person, place, and time.  Skin: No rash noted.  Psychiatric: Mood, affect and judgment normal.    Assessment and Plan :  1. Contusion of rib on left side, initial encounter Larey Seat between a dumpster and concrete slab while trying to throw away a 5 gallon bucket of trash on 04-11-19. Slight pain with cough or sneeze. No congestion, shortness of breath, deformity or paradoxical movement of ribs. May use rib belt for support and take Advil prn discomfort. Should limit heavy lifting and recheck prn. Asked to postpone x-ray evaluation.

## 2019-04-16 NOTE — Patient Instructions (Signed)
Rib Contusion A rib contusion is a deep bruise on your rib area. Contusions are the result of a blunt trauma that causes bleeding and injury to the tissues under the skin. A rib contusion may involve bruising of the ribs and of the skin and muscles in the area. The skin over the contusion may turn blue, purple, or yellow. Minor injuries will give you a painless contusion. More severe contusions may stay painful and swollen for a few weeks. What are the causes? This condition is usually caused by a blow, trauma, or direct force to an area of the body. This often occurs while playing contact sports. What are the signs or symptoms? Symptoms of this condition include:  Swelling and redness of the injured area.  Discoloration of the injured area.  Tenderness and soreness of the injured area.  Pain with or without movement. How is this diagnosed? This condition may be diagnosed based on:  Your symptoms and medical history.  A physical exam.  Imaging tests-such as an X-ray, CT scan, or MRI-to determine if there were internal injuries or broken bones (fractures). How is this treated? This condition may be treated with:  Rest. This is often the best treatment for a rib contusion.  Icing. This reduces swelling and inflammation.  Deep-breathing exercises. These may be recommended to reduce the risk for lung collapse and pneumonia.  Medicines. Over-the-counter or prescription medicines may be given to control pain.  Injection of a numbing medicine around the nerve near your injury (nerve block). Follow these instructions at home:     Medicines  Take over-the-counter and prescription medicines only as told by your health care provider.  Do not drive or use heavy machinery while taking prescription pain medicine.  If you are taking prescription pain medicine, take actions to prevent or treat constipation. Your health care provider may recommend that you: ? Drink enough fluid to keep  your urine pale yellow. ? Eat foods that are high in fiber, such as fresh fruits and vegetables, whole grains, and beans. ? Limit foods that are high in fat and processed sugars, such as fried or sweet foods. ? Take an over-the-counter or prescription medicine for constipation. Managing pain, stiffness, and swelling  If directed, put ice on the injured area: ? Put ice in a plastic bag. ? Place a towel between your skin and the bag. ? Leave the ice on for 20 minutes, 2-3 times a day.  Rest the injured area. Avoid strenuous activity and any activities or movements that cause pain. Be careful during activities and avoid bumping the injured area.  Do not lift anything that is heavier than 5 lb (2.3 kg), or the limit that you are told, until your health care provider says that it is safe. General instructions  Do not use any products that contain nicotine or tobacco, such as cigarettes and e-cigarettes. These can delay healing. If you need help quitting, ask your health care provider.  Do deep-breathing exercises as told by your health care provider.  If you were given an incentive spirometer, use it every 1-2 hours while you are awake, or as recommended by your health care provider. This device measures how well you are filling your lungs with each breath.  Keep all follow-up visits as told by your health care provider. This is important. Contact a health care provider if you have:  Increased bruising or swelling.  Pain that is not controlled with treatment.  A fever. Get help right away if   you:  Have difficulty breathing or shortness of breath.  Develop a continual cough or you cough up thick or bloody sputum.  Feel nauseous or you vomit.  Have pain in your abdomen. Summary  A rib contusion is a deep bruise on your rib area. Contusions are the result of a blunt trauma that causes bleeding and injury to the tissues under the skin.  The skin overlying the contusion may turn  blue, purple, or yellow. Minor injuries may give you a painless contusion. More severe contusions may stay painful and swollen for a few weeks.  Rest the injured area. Avoid strenuous activity and any activities or movements that cause pain. This information is not intended to replace advice given to you by your health care provider. Make sure you discuss any questions you have with your health care provider. Document Released: 03/06/2001 Document Revised: 07/10/2017 Document Reviewed: 07/10/2017 Elsevier Patient Education  2020 [employer]  

## 2019-10-16 NOTE — Progress Notes (Deleted)
    Established patient visit   I,Aylee Littrell,acting as a scribe for [name], MD.,have documented all relevant documentation on the behalf of [name], MD,as directed by  [name], MD while in the presence of [name], MD.   Patient: Seth Graham.   DOB: 03/22/1966   54 y.o. Male  MRN: [id number] Visit Date: 10/16/2019  Today's healthcare provider: [name], MD   No chief complaint on file.  Subjective    HPI ***  {Show patient history (optional):23778::" "}   Medications: Outpatient Medications Prior to Visit  Medication Sig  . cetirizine-pseudoephedrine (ZYRTEC-D) 5-120 MG tablet Take 1 tablet by mouth daily as needed for allergies.  . cyclobenzaprine (FLEXERIL) 10 MG tablet Take 1 tablet (10 mg total) by mouth 3 (three) times daily as needed for muscle spasms.  . fluticasone (FLONASE) 50 MCG/ACT nasal spray   . naproxen (NAPROSYN) 500 MG tablet Take 1 tablet (500 mg total) by mouth 2 (two) times daily with a meal. (Patient not taking: Reported on 02/19/2019)  . predniSONE (STERAPRED UNI-PAK 21 TAB) 10 MG (21) TBPK tablet Taper as directed.   No facility-administered medications prior to visit.    Review of Systems  {Show previous labs (optional):23779::" "}   Objective    There were no vitals taken for this visit. {Show previous vital signs (optional):23777::" "}  Physical Exam  ***  No results found for any visits on 10/20/19.  Assessment & Plan    ***  No follow-ups on file.      {provider attestation***:1}   [name], MD  [facility] [phone] (phone) [phone] (fax)  [facility]

## 2019-10-20 ENCOUNTER — Encounter: Payer: Self-pay | Admitting: Family Medicine

## 2019-10-20 ENCOUNTER — Other Ambulatory Visit: Payer: Self-pay

## 2019-10-20 ENCOUNTER — Ambulatory Visit (INDEPENDENT_AMBULATORY_CARE_PROVIDER_SITE_OTHER): Payer: BC Managed Care – PPO | Admitting: Family Medicine

## 2019-10-20 VITALS — BP 130/87 | HR 69 | Temp 97.1°F | Resp 16 | Ht 72.0 in | Wt 217.0 lb

## 2019-10-20 DIAGNOSIS — Z0000 Encounter for general adult medical examination without abnormal findings: Secondary | ICD-10-CM | POA: Diagnosis not present

## 2019-10-20 DIAGNOSIS — Z125 Encounter for screening for malignant neoplasm of prostate: Secondary | ICD-10-CM

## 2019-10-20 LAB — POCT URINALYSIS DIPSTICK
Bilirubin, UA: NEGATIVE
Glucose, UA: NEGATIVE
Ketones, UA: NEGATIVE
Leukocytes, UA: NEGATIVE
Nitrite, UA: NEGATIVE
Protein, UA: NEGATIVE
Spec Grav, UA: 1.02 (ref 1.010–1.025)
Urobilinogen, UA: 0.2 E.U./dL
pH, UA: 6 (ref 5.0–8.0)

## 2019-10-20 NOTE — Progress Notes (Signed)
Complete physical exam  I,Seth Graham,acting as a scribe for Seth Mans, MD.,have documented all relevant documentation on the behalf of Seth Mans, MD,as directed by  Seth Mans, MD while in the presence of Seth Mans, MD.   Patient: Seth Graham.   DOB: 04-14-1966   54 y.o. Male  MRN: [id number] Visit Date: 10/20/2019  Today's healthcare provider: [name], MD   Chief Complaint  Patient presents with  . Annual Exam   Subjective    Seth Graham. is a 54 y.o. male who presents today for a complete physical exam.  He reports consuming a general diet. Home exercise routine includes walking. He generally feels well. He reports sleeping well. He does not have additional problems to discuss today.  Patient is a married [place] and is a father of two.  His daughter is Consulting civil engineer at [employer] and his son is a Consulting civil engineer at [employer]. he works in Arts administrator. HPI    History reviewed. No pertinent past medical history. Past Surgical History:  Procedure Laterality Date  . APPENDECTOMY  2004  . COLONOSCOPY WITH PROPOFOL N/A 02/01/2017   Procedure: COLONOSCOPY WITH PROPOFOL;  Surgeon: Wyline Mood, MD;  Location: [facility] ENDOSCOPY;  Service: Endoscopy;  Laterality: N/A;   Social History   Socioeconomic History  . Marital status: Married    Spouse name: Not on file  . Number of children: Not on file  . Years of education: Not on file  . Highest education level: Not on file  Occupational History  . Not on file  Tobacco Use  . Smoking status: Never Smoker  . Smokeless tobacco: Never Used  Substance and Sexual Activity  . Alcohol use: No  . Drug use: No  . Sexual activity: Not on file  Other Topics Concern  . Not on file  Social History Narrative  . Not on file   Social Determinants of Health   Financial Resource Strain:   . Difficulty of Paying Living Expenses:   Food Insecurity:   . Worried About Brewing technologist in the Last Year:   . Barista in the Last Year:   Transportation Needs:   . Freight forwarder (Medical):   Marland Kitchen Lack of Transportation (Non-Medical):   Physical Activity:   . Days of Exercise per Week:   . Minutes of Exercise per Session:   Stress:   . Feeling of Stress :   Social Connections:   . Frequency of Communication with Friends and Family:   . Frequency of Social Gatherings with Friends and Family:   . Attends Religious Services:   . Active Member of Clubs or Organizations:   . Attends Banker Meetings:   Marland Kitchen Marital Status:   Intimate Partner Violence:   . Fear of Current or Ex-Partner:   . Emotionally Abused:   Marland Kitchen Physically Abused:   . Sexually Abused:    Family Status  Relation Name Status  . Mother  Alive  . Father  Alive  . Sister  Alive  . Sister  Alive   Family History  Problem Relation Age of Onset  . COPD Father   . Cancer Father        prostate and colorectal adenoma   No Known Allergies  Patient Care Team: Maple Hudson., MD as PCP - General (Family Medicine)   Medications: Outpatient Medications Prior to Visit  Medication Sig  .  cetirizine-pseudoephedrine (ZYRTEC-D) 5-120 MG tablet Take 1 tablet by mouth daily as needed for allergies.  . fluticasone (FLONASE) 50 MCG/ACT nasal spray   . cyclobenzaprine (FLEXERIL) 10 MG tablet Take 1 tablet (10 mg total) by mouth 3 (three) times daily as needed for muscle spasms. (Patient not taking: Reported on 10/20/2019)  . naproxen (NAPROSYN) 500 MG tablet Take 1 tablet (500 mg total) by mouth 2 (two) times daily with a meal. (Patient not taking: Reported on 02/19/2019)  . predniSONE (STERAPRED UNI-PAK 21 TAB) 10 MG (21) TBPK tablet Taper as directed. (Patient not taking: Reported on 10/20/2019)   No facility-administered medications prior to visit.    Review of Systems  Constitutional: Negative.   HENT: Positive for sinus pressure.   Eyes: Positive for itching.   Respiratory: Negative.   Cardiovascular: Negative.   Gastrointestinal: Negative.   Musculoskeletal: Positive for arthralgias.  Skin: Positive for color change.  Allergic/Immunologic: Positive for environmental allergies.  Neurological: Negative.   Hematological: Negative.   Psychiatric/Behavioral: Negative.        Objective    BP 130/87 (BP Location: Right Arm, Patient Position: Sitting, Cuff Size: Large)   Pulse 69   Temp (!) 97.1 F (36.2 C) (Other (Comment))   Resp 16   Ht 6' (1.829 m)   Wt 217 lb (98.4 kg)   SpO2 96%   BMI 29.43 kg/m     Physical Exam Vitals reviewed.  Constitutional:      Appearance: He is well-developed.  HENT:     Head: Normocephalic and atraumatic.     Right Ear: External ear normal.     Left Ear: External ear normal.     Nose: Nose normal.  Eyes:     General: No scleral icterus.    Conjunctiva/sclera: Conjunctivae normal.  Neck:     Thyroid: No thyromegaly.     Vascular: No carotid bruit.  Cardiovascular:     Rate and Rhythm: Normal rate and regular rhythm.     Heart sounds: Normal heart sounds.  Pulmonary:     Effort: Pulmonary effort is normal.     Breath sounds: Normal breath sounds.  Abdominal:     Palpations: Abdomen is soft.  Genitourinary:    Penis: Normal.      Testes: Normal.     Prostate: Normal.     Rectum: Normal.  Lymphadenopathy:     Cervical: No cervical adenopathy.  Skin:    General: Skin is warm and dry.  Neurological:     Mental Status: He is alert and oriented to person, place, and time.  Psychiatric:        Behavior: Behavior normal.        Thought Content: Thought content normal.        Judgment: Judgment normal.       Depression Screen  PHQ 2/9 Scores 10/20/2019 12/24/2017 12/03/2016  PHQ - 2 Score 0 2 0  PHQ- 9 Score 2 7 2    Results of the Epworth flowsheet 10/20/2019  Sitting and reading 0  Watching TV 1  Sitting, inactive in a public place (e.g. a theatre or a meeting) 1  As a passenger in a  car for an hour without a break 1  Lying down to rest in the afternoon when circumstances permit 0  Sitting and talking to someone 0  Sitting quietly after a lunch without alcohol 0  In a car, while stopped for a few minutes in traffic 0  Total score 3  No results found for any visits on 10/20/19.  Assessment & Plan    Routine Health Maintenance and Physical Exam  Exercise Activities and Dietary recommendations Goals   None     Immunization History  Administered Date(s) Administered  . Influenza Inj Mdck Quad Pf 03/31/2017  . Influenza,inj,Quad PF,6+ Mos 03/31/2017, 03/23/2018  . Influenza-Unspecified 03/31/2017, 04/10/2019  . Td 12/03/2016  . Tdap 10/16/2006    Health Maintenance  Topic Date Due  . HIV Screening  Never done  . INFLUENZA VACCINE  01/24/2020  . TETANUS/TDAP  12/04/2026  . COLONOSCOPY  02/02/2027    Discussed health benefits of physical activity, and encouraged him to engage in regular exercise appropriate for his age and condition.  1. Annual physical exam  - CBC w/Diff/Platelet - Comprehensive Metabolic Panel (CMET) - Lipid panel - TSH - PSA  2. Prostate cancer screening  - PSA   No follow-ups on file.     I, Wilhemena Durie, MD, have reviewed all documentation for this visit. The documentation on 10/22/19 for the exam, diagnosis, procedures, and orders are all accurate and complete.    Cal Gindlesperger Cranford Mon, MD  [facility] [phone] (phone) [phone] (fax)  [place]

## 2019-10-21 LAB — CBC WITH DIFFERENTIAL/PLATELET
Basophils Absolute: 0.1 10*3/uL (ref 0.0–0.2)
Basos: 1 %
EOS (ABSOLUTE): 0.2 10*3/uL (ref 0.0–0.4)
Eos: 4 %
Hematocrit: 40.4 % (ref 37.5–51.0)
Hemoglobin: 13.4 g/dL (ref 13.0–17.7)
Immature Grans (Abs): 0 10*3/uL (ref 0.0–0.1)
Immature Granulocytes: 0 %
Lymphocytes Absolute: 1.9 10*3/uL (ref 0.7–3.1)
Lymphs: 32 %
MCH: 26.3 pg — ABNORMAL LOW (ref 26.6–33.0)
MCHC: 33.2 g/dL (ref 31.5–35.7)
MCV: 79 fL (ref 79–97)
Monocytes Absolute: 0.5 10*3/uL (ref 0.1–0.9)
Monocytes: 8 %
Neutrophils Absolute: 3.3 10*3/uL (ref 1.4–7.0)
Neutrophils: 55 %
Platelets: 234 10*3/uL (ref 150–450)
RBC: 5.1 x10E6/uL (ref 4.14–5.80)
RDW: 13.8 % (ref 11.6–15.4)
WBC: 6 10*3/uL (ref 3.4–10.8)

## 2019-10-21 LAB — COMPREHENSIVE METABOLIC PANEL
ALT: 13 IU/L (ref 0–44)
AST: 20 IU/L (ref 0–40)
Albumin/Globulin Ratio: 1.8 (ref 1.2–2.2)
Albumin: 4.5 g/dL (ref 3.8–4.9)
Alkaline Phosphatase: 92 IU/L (ref 39–117)
BUN/Creatinine Ratio: 11 (ref 9–20)
BUN: 12 mg/dL (ref 6–24)
Bilirubin Total: 0.3 mg/dL (ref 0.0–1.2)
CO2: 26 mmol/L (ref 20–29)
Calcium: 9.3 mg/dL (ref 8.7–10.2)
Chloride: 103 mmol/L (ref 96–106)
Creatinine, Ser: 1.08 mg/dL (ref 0.76–1.27)
GFR calc Af Amer: 90 mL/min/{1.73_m2} (ref >59)
GFR calc non Af Amer: 78 mL/min/{1.73_m2} (ref >59)
Globulin, Total: 2.5 g/dL (ref 1.5–4.5)
Glucose: 107 mg/dL — ABNORMAL HIGH (ref 65–99)
Potassium: 4.3 mmol/L (ref 3.5–5.2)
Sodium: 140 mmol/L (ref 134–144)
Total Protein: 7 g/dL (ref 6.0–8.5)

## 2019-10-21 LAB — LIPID PANEL
Chol/HDL Ratio: 5.8 ratio — ABNORMAL HIGH (ref 0.0–5.0)
Cholesterol, Total: 215 mg/dL — ABNORMAL HIGH (ref 100–199)
HDL: 37 mg/dL — ABNORMAL LOW (ref >39)
LDL Chol Calc (NIH): 153 mg/dL — ABNORMAL HIGH (ref 0–99)
Triglycerides: 137 mg/dL (ref 0–149)
VLDL Cholesterol Cal: 25 mg/dL (ref 5–40)

## 2019-10-21 LAB — PSA: Prostate Specific Ag, Serum: 0.7 ng/mL (ref 0.0–4.0)

## 2019-10-21 LAB — TSH: TSH: 2.14 u[IU]/mL (ref 0.450–4.500)

## 2019-10-23 ENCOUNTER — Telehealth: Payer: Self-pay

## 2019-10-23 NOTE — Telephone Encounter (Signed)
Called to advise patient on labs, unable to leave vm. Will try to contact patient later to advise.

## 2019-10-23 NOTE — Telephone Encounter (Signed)
Patient advised.

## 2019-10-23 NOTE — Telephone Encounter (Signed)
Pt returned call to office. Pt requests call back. °

## 2019-10-23 NOTE — Telephone Encounter (Signed)
-----   Message from Maple Hudson., MD sent at 10/23/2019  8:31 AM EDT ----- Labs stable other than mild prediabetes.  Work on diet and exercise as discussed.

## 2020-04-19 ENCOUNTER — Telehealth: Payer: Self-pay

## 2020-04-19 NOTE — Telephone Encounter (Signed)
Copied from CRM [id number]. Topic: General - Other >> Apr 19, 2020  8:22 AM Jaquita Rector A wrote: Reason for CRM: Patient called in to inquire about insurance verification form dropped off last week to be completed and signed by Dr Sullivan Lone. Patient would like to pick up this form today please so asking for a call back today at Ph# [phone]

## 2020-04-19 NOTE — Telephone Encounter (Signed)
I signed this am.

## 2020-10-20 ENCOUNTER — Encounter: Payer: Self-pay | Admitting: Family Medicine

## 2020-10-20 ENCOUNTER — Ambulatory Visit (INDEPENDENT_AMBULATORY_CARE_PROVIDER_SITE_OTHER): Payer: PRIVATE HEALTH INSURANCE | Admitting: Family Medicine

## 2020-10-20 ENCOUNTER — Other Ambulatory Visit: Payer: Self-pay

## 2020-10-20 VITALS — BP 142/90 | HR 72 | Temp 98.0°F | Resp 16 | Ht 71.0 in | Wt 212.0 lb

## 2020-10-20 DIAGNOSIS — Z1389 Encounter for screening for other disorder: Secondary | ICD-10-CM | POA: Diagnosis not present

## 2020-10-20 DIAGNOSIS — R1032 Left lower quadrant pain: Secondary | ICD-10-CM

## 2020-10-20 DIAGNOSIS — Z0000 Encounter for general adult medical examination without abnormal findings: Secondary | ICD-10-CM | POA: Diagnosis not present

## 2020-10-20 DIAGNOSIS — Z125 Encounter for screening for malignant neoplasm of prostate: Secondary | ICD-10-CM

## 2020-10-20 DIAGNOSIS — G8929 Other chronic pain: Secondary | ICD-10-CM

## 2020-10-20 DIAGNOSIS — M545 Low back pain, unspecified: Secondary | ICD-10-CM | POA: Diagnosis not present

## 2020-10-20 LAB — POCT URINALYSIS DIPSTICK
Blood, UA: POSITIVE
Glucose, UA: NEGATIVE
Leukocytes, UA: NEGATIVE
Nitrite, UA: NEGATIVE
Protein, UA: POSITIVE — AB
Spec Grav, UA: >=1.03 — AB (ref 1.010–1.025)
Urobilinogen, UA: 0.2 E.U./dL
pH, UA: 6 (ref 5.0–8.0)

## 2020-10-20 NOTE — Progress Notes (Signed)
Complete physical exam   Patient: Seth Graham.   DOB: 06-Oct-1965   55 y.o. Male  MRN: [id number] Visit Date: 10/20/2020  Today's healthcare provider: [name], MD   Chief Complaint  Patient presents with  . Annual Exam   Subjective    Seth Sites. is a 55 y.o. male who presents today for a complete physical exam.  Seth Graham reports consuming a general diet. The patient does not participate in regular exercise at present. Seth Graham generally feels well. Seth Graham reports sleeping well. Seth Graham does not have additional problems to discuss today.  Patient overall feels well.  Seth Graham has a new mattress and is now sleeping better.  His low back is stiff in the morning is a chronic issue.  Seth Graham gets better as the day goes on. Seth Graham has had some mild left lower quadrant discomfort with walking that Seth Graham feels like it is pulling sensation for the past 1 to 2 months.  No weightbearing pain.  No GI or GU symptoms.  No weight loss or sweats.  No past medical history on file. Past Surgical History:  Procedure Laterality Date  . APPENDECTOMY  2004  . COLONOSCOPY WITH PROPOFOL N/A 02/01/2017   Procedure: COLONOSCOPY WITH PROPOFOL;  Surgeon: Wyline Mood, MD;  Location: [facility] ENDOSCOPY;  Service: Endoscopy;  Laterality: N/A;   Social History   Socioeconomic History  . Marital status: Married    Spouse name: Not on file  . Number of children: Not on file  . Years of education: Not on file  . Highest education level: Not on file  Occupational History  . Not on file  Tobacco Use  . Smoking status: Never Smoker  . Smokeless tobacco: Never Used  Vaping Use  . Vaping Use: Never used  Substance and Sexual Activity  . Alcohol use: No  . Drug use: No  . Sexual activity: Not on file  Other Topics Concern  . Not on file  Social History Narrative  . Not on file   Social Determinants of Health   Financial Resource Strain: Not on file  Food Insecurity: Not on file  Transportation Needs: Not on  file  Physical Activity: Not on file  Stress: Not on file  Social Connections: Not on file  Intimate Partner Violence: Not on file   Family Status  Relation Name Status  . Mother  Alive  . Father  Alive  . Sister  Alive  . Sister  Alive   Family History  Problem Relation Age of Onset  . COPD Father   . Cancer Father        prostate and colorectal adenoma   No Known Allergies  Patient Care Team: Maple Hudson., MD as PCP - General (Family Medicine)   Medications: Outpatient Medications Prior to Visit  Medication Sig  . cetirizine-pseudoephedrine (ZYRTEC-D) 5-120 MG tablet Take 1 tablet by mouth daily as needed for allergies.  . fluticasone (FLONASE) 50 MCG/ACT nasal spray   . cyclobenzaprine (FLEXERIL) 10 MG tablet Take 1 tablet (10 mg total) by mouth 3 (three) times daily as needed for muscle spasms. (Patient not taking: No sig reported)  . naproxen (NAPROSYN) 500 MG tablet Take 1 tablet (500 mg total) by mouth 2 (two) times daily with a meal. (Patient not taking: No sig reported)  . predniSONE (STERAPRED UNI-PAK 21 TAB) 10 MG (21) TBPK tablet Taper as directed. (Patient not taking: No sig reported)   No facility-administered medications prior  to visit.    Review of Systems  All other systems reviewed and are negative.      Objective    BP (!) 142/90   Pulse 72   Temp 98 F (36.7 C)   Resp 16   Ht 5\' 11"  (1.803 m)   Wt 212 lb (96.2 kg)   BMI 29.57 kg/m  BP Readings from Last 3 Encounters:  10/20/20 (!) 142/90  10/20/19 130/87  04/16/19 122/70   Wt Readings from Last 3 Encounters:  10/20/20 212 lb (96.2 kg)  10/20/19 217 lb (98.4 kg)  04/16/19 203 lb (92.1 kg)      Physical Exam Vitals reviewed.  Constitutional:      Appearance: Seth Graham is well-developed.  HENT:     Head: Normocephalic and atraumatic.     Right Ear: External ear normal.     Left Ear: External ear normal.     Nose: Nose normal.  Eyes:     General: No scleral icterus.     Conjunctiva/sclera: Conjunctivae normal.  Neck:     Thyroid: No thyromegaly.     Vascular: No carotid bruit.  Cardiovascular:     Rate and Rhythm: Normal rate and regular rhythm.     Heart sounds: Normal heart sounds.  Pulmonary:     Effort: Pulmonary effort is normal.     Breath sounds: Normal breath sounds.  Abdominal:     Palpations: Abdomen is soft.     Comments: Very minimal tenderness in the left lower quadrant without guarding mass-effect or rebound.  Genitourinary:    Penis: Normal.      Testes: Normal.     Prostate: Normal.     Rectum: Normal.  Lymphadenopathy:     Cervical: No cervical adenopathy.  Skin:    General: Skin is warm and dry.  Neurological:     Mental Status: Seth Graham is alert and oriented to person, place, and time.  Psychiatric:        Behavior: Behavior normal.        Thought Content: Thought content normal.        Judgment: Judgment normal.       Last depression screening scores PHQ 2/9 Scores 10/20/2019 12/24/2017 12/03/2016  PHQ - 2 Score 0 2 0  PHQ- 9 Score 2 7 2    Last fall risk screening Fall Risk  12/01/2015  Falls in the past year? No   Last Audit-C alcohol use screening Alcohol Use Disorder Test (AUDIT) 10/20/2019  1. How often do you have a drink containing alcohol? 0  2. How many drinks containing alcohol do you have on a typical day when you are drinking? 0  3. How often do you have six or more drinks on one occasion? 0  AUDIT-C Score 0  Alcohol Brief Interventions/Follow-up -   A score of 3 or more in women, and 4 or more in men indicates increased risk for alcohol abuse, EXCEPT if all of the points are from question 1   No results found for any visits on 10/20/20.  Assessment & Plan    Routine Health Maintenance and Physical Exam  Exercise Activities and Dietary recommendations Goals   None     Immunization History  Administered Date(s) Administered  . Influenza Inj Mdck Quad Pf 03/31/2017  . Influenza,inj,Quad PF,6+ Mos  03/31/2017, 03/23/2018  . Influenza-Unspecified 03/31/2017, 04/10/2019  . Td 12/03/2016  . Tdap 10/16/2006    Health Maintenance  Topic Date Due  . Hepatitis C Screening  Never  done  . COVID-19 Vaccine (1) Never done  . HIV Screening  Never done  . INFLUENZA VACCINE  01/23/2021  . TETANUS/TDAP  12/04/2026  . COLONOSCOPY (Pts 45-31yrs Insurance coverage will need to be confirmed)  02/02/2027  . HPV VACCINES  Aged Out    Discussed health benefits of physical activity, and encouraged Seth Graham to engage in regular exercise appropriate for his age and condition. 1. Annual physical exam Patient had colonoscopy at age 61 - Lipid panel - TSH - CBC w/Diff/Platelet - Comprehensive Metabolic Panel (CMET)  2. Prostate cancer screening  - PSA  3. Screening for blood or protein in urine  - POCT urinalysis dipstick  4. Chronic bilateral low back pain without sciatica This appears to be musculoskeletal.  Better as Seth Graham gets up and moves around.  5. Left lower quadrant pain Appears to be muscular.  Seth Graham will come back if this changes or persist.   No follow-ups on file.     I, [name], MD, have reviewed all documentation for this visit. The documentation on 10/25/20 for the exam, diagnosis, procedures, and orders are all accurate and complete.    Perkins Molina Wendelyn Breslow, MD  [facility] [phone] (phone) [phone] (fax)  [facility]

## 2020-10-20 NOTE — Patient Instructions (Signed)
Try Derenda Mis or Omron blood pressure cuff to monitor BP at home.

## 2020-10-21 LAB — COMPREHENSIVE METABOLIC PANEL
ALT: 11 IU/L (ref 0–44)
AST: 17 IU/L (ref 0–40)
Albumin/Globulin Ratio: 2.1 (ref 1.2–2.2)
Albumin: 4.6 g/dL (ref 3.8–4.9)
Alkaline Phosphatase: 102 IU/L (ref 44–121)
BUN/Creatinine Ratio: 11 (ref 9–20)
BUN: 11 mg/dL (ref 6–24)
Bilirubin Total: 0.5 mg/dL (ref 0.0–1.2)
CO2: 26 mmol/L (ref 20–29)
Calcium: 9.2 mg/dL (ref 8.7–10.2)
Chloride: 103 mmol/L (ref 96–106)
Creatinine, Ser: 1 mg/dL (ref 0.76–1.27)
Globulin, Total: 2.2 g/dL (ref 1.5–4.5)
Glucose: 108 mg/dL — ABNORMAL HIGH (ref 65–99)
Potassium: 4.1 mmol/L (ref 3.5–5.2)
Sodium: 140 mmol/L (ref 134–144)
Total Protein: 6.8 g/dL (ref 6.0–8.5)
eGFR: 89 mL/min/{1.73_m2} (ref >59)

## 2020-10-21 LAB — CBC WITH DIFFERENTIAL/PLATELET
Basophils Absolute: 0 10*3/uL (ref 0.0–0.2)
Basos: 1 %
EOS (ABSOLUTE): 0.2 10*3/uL (ref 0.0–0.4)
Eos: 3 %
Hematocrit: 41.7 % (ref 37.5–51.0)
Hemoglobin: 14.1 g/dL (ref 13.0–17.7)
Immature Grans (Abs): 0 10*3/uL (ref 0.0–0.1)
Immature Granulocytes: 0 %
Lymphocytes Absolute: 1.8 10*3/uL (ref 0.7–3.1)
Lymphs: 33 %
MCH: 26.7 pg (ref 26.6–33.0)
MCHC: 33.8 g/dL (ref 31.5–35.7)
MCV: 79 fL (ref 79–97)
Monocytes Absolute: 0.5 10*3/uL (ref 0.1–0.9)
Monocytes: 9 %
Neutrophils Absolute: 2.9 10*3/uL (ref 1.4–7.0)
Neutrophils: 54 %
Platelets: 202 10*3/uL (ref 150–450)
RBC: 5.28 x10E6/uL (ref 4.14–5.80)
RDW: 14.4 % (ref 11.6–15.4)
WBC: 5.3 10*3/uL (ref 3.4–10.8)

## 2020-10-21 LAB — LIPID PANEL
Chol/HDL Ratio: 5.5 ratio — ABNORMAL HIGH (ref 0.0–5.0)
Cholesterol, Total: 220 mg/dL — ABNORMAL HIGH (ref 100–199)
HDL: 40 mg/dL (ref >39)
LDL Chol Calc (NIH): 156 mg/dL — ABNORMAL HIGH (ref 0–99)
Triglycerides: 134 mg/dL (ref 0–149)
VLDL Cholesterol Cal: 24 mg/dL (ref 5–40)

## 2020-10-21 LAB — PSA: Prostate Specific Ag, Serum: 0.9 ng/mL (ref 0.0–4.0)

## 2020-10-21 LAB — TSH: TSH: 2.15 u[IU]/mL (ref 0.450–4.500)

## 2020-12-23 ENCOUNTER — Ambulatory Visit: Payer: Self-pay | Admitting: *Deleted

## 2020-12-23 NOTE — Telephone Encounter (Signed)
Patient called to report he has tested + COVID. Patient symptoms started Wednesday evening and presently he reports: headache, chills, sinus congestion, sore throat, cough. Advised per COVID protocol and also information given about UC virtual visit for symptoms through MyChart- if needed over the weekend. Call sent to office for PCP review.

## 2020-12-23 NOTE — Telephone Encounter (Signed)
Summary: covid advice   Pt was diagnoses with covid today / Pt had a sore throat, chills , congestion, slight headache / pt just wanted advise on what he can or should do/ please advise / pt is feeling a little better today     Reason for Disposition  [1] COVID-19 diagnosed by positive lab test (e.g., PCR, rapid self-test kit) AND [2] mild symptoms (e.g., cough, fever, others) AND [5] no complications or SOB  Answer Assessment - Initial Assessment Questions 1. COVID-19 DIAGNOSIS: "Who made your COVID-19 diagnosis?" "Was it confirmed by a positive lab test or self-test?" If not diagnosed by a doctor (or NP/PA), ask "Are there lots of cases (community spread) where you live?" Note: See public health department website, if unsure.     Lab test-[facility] 2. COVID-19 EXPOSURE: "Was there any known exposure to COVID before the symptoms began?" CDC Definition of close contact: within 6 feet (2 meters) for a total of 15 minutes or more over a 24-hour period.      No known exposure 3. ONSET: "When did the COVID-19 symptoms start?"      Wednesday 4. WORST SYMPTOM: "What is your worst symptom?" (e.g., cough, fever, shortness of breath, muscle aches)     headache 5. COUGH: "Do you have a cough?" If Yes, ask: "How bad is the cough?"       Yes- productive 6. FEVER: "Do you have a fever?" If Yes, ask: "What is your temperature, how was it measured, and when did it start?"     chills 7. RESPIRATORY STATUS: "Describe your breathing?" (e.g., shortness of breath, wheezing, unable to speak)      fine 8. BETTER-SAME-WORSE: "Are you getting better, staying the same or getting worse compared to yesterday?"  If getting worse, ask, "In what way?"     Better- improvement in congestion 9. HIGH RISK DISEASE: "Do you have any chronic medical problems?" (e.g., asthma, heart or lung disease, weak immune system, obesity, etc.)     no 10. VACCINE: "Have you had the COVID-19 vaccine?" If Yes, ask: "Which one, how many shots, when  did you get it?"       Yes- pfizer 11. BOOSTER: "Have you received your COVID-19 booster?" If Yes, ask: "Which one and when did you get it?"       Yes- moderna 12. PREGNANCY: "Is there any chance you are pregnant?" "When was your last menstrual period?"       N/a 13. OTHER SYMPTOMS: "Do you have any other symptoms?"  (e.g., chills, fatigue, headache, loss of smell or taste, muscle pain, sore throat)       Chills, headache, sore throat, fatigue 14. O2 SATURATION MONITOR:  "Do you use an oxygen saturation monitor (pulse oximeter) at home?" If Yes, ask "What is your reading (oxygen level) today?" "What is your usual oxygen saturation reading?" (e.g., 95%)       no  Protocols used: Coronavirus (COVID-19) Diagnosed or Suspected-A-AH

## 2021-04-21 ENCOUNTER — Ambulatory Visit: Payer: Self-pay

## 2021-04-21 NOTE — Telephone Encounter (Signed)
Pt. Reports he started having tingling left arm, has "some pain as well." This week. No known injury. No other symptoms. No availability today. Instructed to go to UC, declines. Requests appointment with Dr. Sullivan Lone. Appointment made.    Reason for Disposition  [1] Tingling (e.g., pins and needles) of the face, arm / hand, or leg / foot on one side of the body AND [2] present now (Exceptions: chronic/recurrent symptom lasting > 4 weeks or tingling from known cause, such as: bumped elbow, carpal tunnel syndrome, pinched nerve, frostbite)  Answer Assessment - Initial Assessment Questions 1. SYMPTOM: "What is the main symptom you are concerned about?" (e.g., weakness, numbness)     Tingling left arm 2. ONSET: "When did this start?" (minutes, hours, days; while sleeping)     This week 3. LAST NORMAL: "When was the last time you (the patient) were normal (no symptoms)?"     Last week 4. PATTERN "Does this come and go, or has it been constant since it started?"  "Is it present now?"     Comes and goes 5. CARDIAC SYMPTOMS: "Have you had any of the following symptoms: chest pain, difficulty breathing, palpitations?"     No 6. NEUROLOGIC SYMPTOMS: "Have you had any of the following symptoms: headache, dizziness, vision loss, double vision, changes in speech, unsteady on your feet?"     No 7. OTHER SYMPTOMS: "Do you have any other symptoms?"     No 8. PREGNANCY: "Is there any chance you are pregnant?" "When was your last menstrual period?"     N/a  Protocols used: Neurologic Deficit-A-AH

## 2021-04-26 ENCOUNTER — Ambulatory Visit: Payer: Self-pay

## 2021-04-26 NOTE — Telephone Encounter (Signed)
Pt. Seen at [facility] for tingling, numbness left arm. He was prescribed "some medicines that have helped some." Still has tingling in left index finger. Has an appointment in February. Asking to be worked in sooner. Please advise pt.     Answer Assessment - Initial Assessment Questions 1. SYMPTOM: "What is the main symptom you are concerned about?" (e.g., weakness, numbness)     Numbness left arm 2. ONSET: "When did this start?" (minutes, hours, days; while sleeping)     Last week 3. LAST NORMAL: "When was the last time you (the patient) were normal (no symptoms)?"     Last week 4. PATTERN "Does this come and go, or has it been constant since it started?"  "Is it present now?"     Comes and goes 5. CARDIAC SYMPTOMS: "Have you had any of the following symptoms: chest pain, difficulty breathing, palpitations?"     No 6. NEUROLOGIC SYMPTOMS: "Have you had any of the following symptoms: headache, dizziness, vision loss, double vision, changes in speech, unsteady on your feet?"     No 7. OTHER SYMPTOMS: "Do you have any other symptoms?"     No 8. PREGNANCY: "Is there any chance you are pregnant?" "When was your last menstrual period?"     N/a  Protocols used: Neurologic Deficit-A-AH

## 2021-04-26 NOTE — Telephone Encounter (Signed)
Please advise 

## 2021-04-27 NOTE — Telephone Encounter (Signed)
Patient was scheduled for ov. °

## 2021-05-01 ENCOUNTER — Encounter: Payer: Self-pay | Admitting: Family Medicine

## 2021-05-01 ENCOUNTER — Other Ambulatory Visit: Payer: Self-pay

## 2021-05-01 ENCOUNTER — Ambulatory Visit (INDEPENDENT_AMBULATORY_CARE_PROVIDER_SITE_OTHER): Payer: PRIVATE HEALTH INSURANCE | Admitting: Family Medicine

## 2021-05-01 VITALS — BP 133/97 | HR 74 | Temp 98.5°F | Resp 16 | Ht 70.0 in | Wt 204.3 lb

## 2021-05-01 DIAGNOSIS — M79602 Pain in left arm: Secondary | ICD-10-CM | POA: Diagnosis not present

## 2021-05-01 DIAGNOSIS — M5412 Radiculopathy, cervical region: Secondary | ICD-10-CM

## 2021-05-01 MED ORDER — PREDNISONE 20 MG PO TABS: 20.0000 mg | ORAL_TABLET | Freq: Every day | ORAL | 1 refills | Status: DC

## 2021-05-01 NOTE — Progress Notes (Signed)
Established patient visit   Patient: Seth Graham.   DOB: 11/01/1965   55 y.o. Male  MRN: [id number] Visit Date: 05/01/2021  Today's healthcare provider: [name], MD   Chief Complaint  Patient presents with   Follow-up   Subjective    HPI  Patient was seen at [facility] for tingling and numbness in his left arm. He was prescribed Metaxalone 800 mg and Naproxen. Reports that his arm pain has improved compared to how it was, but still has an off and on pain.  He has been on naproxen and metolazone and the topical capsaicin cream.    Medications: Outpatient Medications Prior to Visit  Medication Sig   cetirizine-pseudoephedrine (ZYRTEC-D) 5-120 MG tablet Take 1 tablet by mouth daily as needed for allergies.   naproxen (NAPROSYN) 500 MG tablet Take 1 tablet (500 mg total) by mouth 2 (two) times daily with a meal.   cyclobenzaprine (FLEXERIL) 10 MG tablet Take 1 tablet (10 mg total) by mouth 3 (three) times daily as needed for muscle spasms. (Patient not taking: Reported on 05/01/2021)   fluticasone (FLONASE) 50 MCG/ACT nasal spray  (Patient not taking: Reported on 05/01/2021)   metaxalone (SKELAXIN) 800 MG tablet Take 800 mg by mouth 3 (three) times daily as needed.   [DISCONTINUED] predniSONE (STERAPRED UNI-PAK 21 TAB) 10 MG (21) TBPK tablet Taper as directed.   No facility-administered medications prior to visit.    Review of Systems  Constitutional:  Negative for appetite change, chills and fever.  Respiratory:  Negative for chest tightness, shortness of breath and wheezing.   Cardiovascular:  Negative for chest pain and palpitations.  Gastrointestinal:  Negative for abdominal pain, nausea and vomiting.       Objective    BP (!) 133/97 (BP Location: Right Arm, Patient Position: Sitting, Cuff Size: Large)   Pulse 74   Temp 98.5 F (36.9 C) (Oral)   Resp 16   Ht 5\' 10"  (1.778 m)   Wt 204 lb 4.8 oz (92.7 kg)   SpO2 97%   BMI 29.31 kg/m  BP Readings from  Last 3 Encounters:  05/01/21 (!) 133/97  10/20/20 (!) 142/90  10/20/19 130/87   Wt Readings from Last 3 Encounters:  05/01/21 204 lb 4.8 oz (92.7 kg)  10/20/20 212 lb (96.2 kg)  10/20/19 217 lb (98.4 kg)      Physical Exam Vitals reviewed.  Constitutional:      Appearance: He is well-developed.  HENT:     Head: Normocephalic and atraumatic.  Eyes:     General: No scleral icterus.    Conjunctiva/sclera: Conjunctivae normal.  Neck:     Thyroid: No thyromegaly.  Cardiovascular:     Rate and Rhythm: Normal rate and regular rhythm.     Heart sounds: Normal heart sounds.  Pulmonary:     Effort: Pulmonary effort is normal.     Breath sounds: Normal breath sounds.  Abdominal:     Palpations: Abdomen is soft.  Musculoskeletal:        General: No tenderness or deformity.  Skin:    General: Skin is warm and dry.  Neurological:     Mental Status: He is alert and oriented to person, place, and time.     Cranial Nerves: No cranial nerve deficit.     Comments: Possible mild weakness of the triceps from the left arm.  Grip strength and biceps is normal. Tinel sign is negative  Psychiatric:  Mood and Affect: Mood normal.        Behavior: Behavior normal.        Thought Content: Thought content normal.        Judgment: Judgment normal.      No results found for any visits on 05/01/21.  Assessment & Plan     1. Cervical radiculopathy We will stop the naproxen and muscle relaxant and try prednisone 20 mg daily for a week. May need MRI but we will go ahead and refer to spine surgery for their opinion. - AMB referral to orthopedics  2. Pain of left upper extremity    No follow-ups on file.      I, [name], MD, have reviewed all documentation for this visit. The documentation on 05/01/21 for the exam, diagnosis, procedures, and orders are all accurate and complete.    Seth Bakos Wendelyn Breslow, MD  [facility] [phone] (phone) [phone]  (fax)  [facility]

## 2021-07-07 DIAGNOSIS — M7541 Impingement syndrome of right shoulder: Secondary | ICD-10-CM | POA: Diagnosis not present

## 2021-07-07 DIAGNOSIS — M542 Cervicalgia: Secondary | ICD-10-CM | POA: Diagnosis not present

## 2021-07-18 DIAGNOSIS — M5412 Radiculopathy, cervical region: Secondary | ICD-10-CM | POA: Diagnosis not present

## 2021-07-24 DIAGNOSIS — M5412 Radiculopathy, cervical region: Secondary | ICD-10-CM | POA: Diagnosis not present

## 2021-07-31 ENCOUNTER — Encounter: Payer: Self-pay | Admitting: Family Medicine

## 2021-07-31 ENCOUNTER — Ambulatory Visit (INDEPENDENT_AMBULATORY_CARE_PROVIDER_SITE_OTHER): Payer: BC Managed Care – PPO | Admitting: Family Medicine

## 2021-07-31 ENCOUNTER — Other Ambulatory Visit: Payer: Self-pay

## 2021-07-31 VITALS — BP 146/96 | HR 64 | Temp 97.9°F | Resp 16 | Wt 211.7 lb

## 2021-07-31 DIAGNOSIS — R030 Elevated blood-pressure reading, without diagnosis of hypertension: Secondary | ICD-10-CM

## 2021-07-31 DIAGNOSIS — M5412 Radiculopathy, cervical region: Secondary | ICD-10-CM

## 2021-07-31 DIAGNOSIS — M79602 Pain in left arm: Secondary | ICD-10-CM

## 2021-07-31 NOTE — Progress Notes (Signed)
Established patient visit   Patient: Seth Graham.   DOB: 26-Feb-1966   55 y.o. Male  MRN: [id number] Visit Date: 07/31/2021  Today's healthcare provider: [name], MD   Chief Complaint  Patient presents with   Follow-up   Subjective    HPI  Patient comes in today for follow-up of radiculopathy.  The gabapentin did not help him.  The decree sensation in the left index finger is still present but otherwise he is better.  Overall he is feeling well. Patient is a [place].  He is married and his children are doing well. Follow up for Cervical radiculopathy  The patient was last seen for this 3 months ago. Changes made at last visit include; We will stop the naproxen and muscle relaxant and try prednisone 20 mg daily for a week. May need MRI but we will go ahead and refer to spine surgery for their opinion. Referred to Orthopedics.   He reports excellent compliance with treatment. He feels that condition is Unchanged. He is not having side effects.   -----------------------------------------------------------------------------------------   Medications: Outpatient Medications Prior to Visit  Medication Sig   cetirizine-pseudoephedrine (ZYRTEC-D) 5-120 MG tablet Take 1 tablet by mouth daily as needed for allergies.   fluticasone (FLONASE) 50 MCG/ACT nasal spray    cyclobenzaprine (FLEXERIL) 10 MG tablet Take 1 tablet (10 mg total) by mouth 3 (three) times daily as needed for muscle spasms. (Patient not taking: Reported on 05/01/2021)   metaxalone (SKELAXIN) 800 MG tablet Take 800 mg by mouth 3 (three) times daily as needed. (Patient not taking: Reported on 07/31/2021)   naproxen (NAPROSYN) 500 MG tablet Take 1 tablet (500 mg total) by mouth 2 (two) times daily with a meal. (Patient not taking: Reported on 07/31/2021)   predniSONE (DELTASONE) 20 MG tablet Take 1 tablet (20 mg total) by mouth daily with breakfast. (Patient not taking: Reported on 07/31/2021)    No facility-administered medications prior to visit.    Review of Systems  Constitutional:  Negative for appetite change, chills and fever.  Respiratory:  Negative for chest tightness, shortness of breath and wheezing.   Cardiovascular:  Negative for chest pain and palpitations.  Gastrointestinal:  Negative for abdominal pain, nausea and vomiting.       Objective    BP (!) 146/96 (BP Location: Right Arm, Cuff Size: Large)    Pulse 64    Temp 97.9 F (36.6 C) (Oral)    Resp 16    Wt 211 lb 11.2 oz (96 kg)    SpO2 99%    BMI 30.38 kg/m  BP Readings from Last 3 Encounters:  07/31/21 (!) 146/96  05/01/21 (!) 133/97  10/20/20 (!) 142/90   Wt Readings from Last 3 Encounters:  07/31/21 211 lb 11.2 oz (96 kg)  05/01/21 204 lb 4.8 oz (92.7 kg)  10/20/20 212 lb (96.2 kg)      Physical Exam Vitals reviewed.  Constitutional:      General: He is not in acute distress.    Appearance: He is well-developed.  HENT:     Head: Normocephalic and atraumatic.     Right Ear: Hearing normal.     Left Ear: Hearing normal.     Nose: Nose normal.  Eyes:     General: Lids are normal. No scleral icterus.       Right eye: No discharge.        Left eye: No discharge.  Conjunctiva/sclera: Conjunctivae normal.  Cardiovascular:     Rate and Rhythm: Normal rate and regular rhythm.     Heart sounds: Normal heart sounds.  Pulmonary:     Effort: Pulmonary effort is normal. No respiratory distress.  Skin:    General: Skin is warm and dry.     Findings: No lesion or rash.  Neurological:     General: No focal deficit present.     Mental Status: He is alert and oriented to person, place, and time.  Psychiatric:        Mood and Affect: Mood normal.        Speech: Speech normal.        Behavior: Behavior normal.        Thought Content: Thought content normal.        Judgment: Judgment normal.      No results found for any visits on 07/31/21.  Assessment & Plan     1. Cervical  radiculopathy Improving.  Patient declines gabapentin.  Continue physical therapy  2. Pain of left upper extremity Resolved.  3. Elevated blood-pressure reading without diagnosis of hypertension Patient to work on regular exercise.  I will see him back this summer for physical   No follow-ups on file.      I, [name], MD, have reviewed all documentation for this visit. The documentation on 07/31/21 for the exam, diagnosis, procedures, and orders are all accurate and complete.  Patient seen and examined by Dr. Julieanne Manson,  note scribed by Sheliah Hatch, NCMA   Americo Vallery Wendelyn Breslow, MD  [facility] [phone] (phone) [phone] (fax)  [facility]

## 2021-08-02 DIAGNOSIS — M5412 Radiculopathy, cervical region: Secondary | ICD-10-CM | POA: Diagnosis not present

## 2021-08-11 DIAGNOSIS — M5412 Radiculopathy, cervical region: Secondary | ICD-10-CM | POA: Diagnosis not present

## 2021-08-18 DIAGNOSIS — M5412 Radiculopathy, cervical region: Secondary | ICD-10-CM | POA: Diagnosis not present

## 2021-09-15 ENCOUNTER — Other Ambulatory Visit: Payer: Self-pay

## 2021-09-15 ENCOUNTER — Ambulatory Visit (INDEPENDENT_AMBULATORY_CARE_PROVIDER_SITE_OTHER): Payer: BC Managed Care – PPO | Admitting: Physician Assistant

## 2021-09-15 ENCOUNTER — Encounter: Payer: Self-pay | Admitting: Physician Assistant

## 2021-09-15 VITALS — BP 137/92 | HR 72 | Ht 71.0 in | Wt 208.6 lb

## 2021-09-15 DIAGNOSIS — J209 Acute bronchitis, unspecified: Secondary | ICD-10-CM | POA: Diagnosis not present

## 2021-09-15 NOTE — Progress Notes (Signed)
?I,Sha'taria Tyson,acting as a Education administrator for [employer], PA-C.,have documented all relevant documentation on the behalf of Mikey Kirschner, PA-C,as directed by  Mikey Kirschner, PA-C while in the presence of Mikey Kirschner, PA-C. ? ?Acute Office Visit ? ?Subjective:  ? ? Patient ID: Seth Graham., male    DOB: July 24, 1965, 56 y.o.   MRN: [id number] ? ?Patient is in today for a cough that has been present for a week. ? ? ?Cough ?Patient complains of  cough with yellowish brown mucus, runny nose and sinus pressure . Symptoms began 5 days ago. Symptoms have been gradually improving since that time.The cough is dry and productive of yellow and brown sputum and is aggravated by dust and pollens. Associated symptoms include: change in voice. Patient does not have new pets. Patient does not have a history of asthma. Patient does have a history of environmental allergens. Patient has not traveled recently. Patient does not have a history of smoking. Patient has not had a previous chest x-ray. Patient has not had a PPD done.Patient reports taking Mucinex max, delsym and zyrtec-D and they are helping with making coughs productive and helps him breath some. Reports negative COVID test at home on Monday or Tuesday.  ? ? ?No past medical history on file. ? ?Past Surgical History:  ?Procedure Laterality Date  ? APPENDECTOMY  2004  ? COLONOSCOPY WITH PROPOFOL N/A 02/01/2017  ? Procedure: COLONOSCOPY WITH PROPOFOL;  Surgeon: Jonathon Bellows, MD;  Location: [facility] ENDOSCOPY;  Service: Endoscopy;  Laterality: N/A;  ? ? ?Family History  ?Problem Relation Age of Onset  ? COPD Father   ? Cancer Father   ?     prostate and colorectal adenoma  ? ? ?Social History  ? ?Socioeconomic History  ? Marital status: Married  ?  Spouse name: Not on file  ? Number of children: Not on file  ? Years of education: Not on file  ? Highest education level: Not on file  ?Occupational History  ? Not on file  ?Tobacco Use  ? Smoking status: Never  ? Smokeless  tobacco: Never  ?Vaping Use  ? Vaping Use: Never used  ?Substance and Sexual Activity  ? Alcohol use: No  ? Drug use: No  ? Sexual activity: Not on file  ?Other Topics Concern  ? Not on file  ?Social History Narrative  ? Not on file  ? ?Social Determinants of Health  ? ?Financial Resource Strain: Not on file  ?Food Insecurity: Not on file  ?Transportation Needs: Not on file  ?Physical Activity: Not on file  ?Stress: Not on file  ?Social Connections: Not on file  ?Intimate Partner Violence: Not on file  ? ? ?Outpatient Medications Prior to Visit  ?Medication Sig Dispense Refill  ? cetirizine-pseudoephedrine (ZYRTEC-D) 5-120 MG tablet Take 1 tablet by mouth daily as needed for allergies.    ? cyclobenzaprine (FLEXERIL) 10 MG tablet Take 1 tablet (10 mg total) by mouth 3 (three) times daily as needed for muscle spasms. (Patient not taking: Reported on 05/01/2021) 30 tablet 0  ? fluticasone (FLONASE) 50 MCG/ACT nasal spray     ? metaxalone (SKELAXIN) 800 MG tablet Take 800 mg by mouth 3 (three) times daily as needed. (Patient not taking: Reported on 07/31/2021)    ? naproxen (NAPROSYN) 500 MG tablet Take 1 tablet (500 mg total) by mouth 2 (two) times daily with a meal. (Patient not taking: Reported on 07/31/2021) 60 tablet 2  ? predniSONE (DELTASONE) 20 MG tablet Take 1  tablet (20 mg total) by mouth daily with breakfast. (Patient not taking: Reported on 07/31/2021) 7 tablet 1  ? ?No facility-administered medications prior to visit.  ? ? ?No Known Allergies ? ?Review of Systems  ?Constitutional:  Negative for fatigue and fever.  ?HENT:  Positive for postnasal drip, rhinorrhea, sore throat and voice change.   ?Respiratory:  Positive for cough. Negative for shortness of breath.   ?Cardiovascular:  Negative for chest pain, palpitations and leg swelling.  ?Neurological:  Negative for dizziness and headaches.  ? ?   ?Objective:  ?  ?Physical Exam ?Constitutional:   ?   General: He is awake.  ?   Appearance: He is well-developed.   ?HENT:  ?   Head: Normocephalic.  ?   Right Ear: Tympanic membrane normal.  ?   Left Ear: Tympanic membrane normal.  ?   Nose: Rhinorrhea present.  ?   Mouth/Throat:  ?   Pharynx: Posterior oropharyngeal erythema present. No oropharyngeal exudate.  ?Eyes:  ?   Conjunctiva/sclera: Conjunctivae normal.  ?Cardiovascular:  ?   Rate and Rhythm: Normal rate and regular rhythm.  ?   Heart sounds: Normal heart sounds.  ?Pulmonary:  ?   Effort: Pulmonary effort is normal.  ?   Breath sounds: Normal breath sounds.  ?Skin: ?   General: Skin is warm.  ?Neurological:  ?   Mental Status: He is alert and oriented to person, place, and time.  ?Psychiatric:     ?   Attention and Perception: Attention normal.     ?   Mood and Affect: Mood normal.     ?   Speech: Speech normal.     ?   Behavior: Behavior is cooperative.  ? ? ?There were no vitals taken for this visit. ?Wt Readings from Last 3 Encounters:  ?07/31/21 211 lb 11.2 oz (96 kg)  ?05/01/21 204 lb 4.8 oz (92.7 kg)  ?10/20/20 212 lb (96.2 kg)  ? ? ?Health Maintenance Due  ?Topic Date Due  ? COVID-19 Vaccine (1) Never done  ? HIV Screening  Never done  ? Hepatitis C Screening  Never done  ? Zoster Vaccines- Shingrix (1 of 2) Never done  ? INFLUENZA VACCINE  01/23/2021  ? ? ?There are no preventive care reminders to display for this patient. ? ? ?Lab Results  ?Component Value Date  ? TSH 2.150 10/20/2020  ? ?Lab Results  ?Component Value Date  ? WBC 5.3 10/20/2020  ? HGB 14.1 10/20/2020  ? HCT 41.7 10/20/2020  ? MCV 79 10/20/2020  ? PLT 202 10/20/2020  ? ?Lab Results  ?Component Value Date  ? NA 140 10/20/2020  ? K 4.1 10/20/2020  ? CO2 26 10/20/2020  ? GLUCOSE 108 (H) 10/20/2020  ? BUN 11 10/20/2020  ? CREATININE 1.00 10/20/2020  ? BILITOT 0.5 10/20/2020  ? ALKPHOS 102 10/20/2020  ? AST 17 10/20/2020  ? ALT 11 10/20/2020  ? PROT 6.8 10/20/2020  ? ALBUMIN 4.6 10/20/2020  ? CALCIUM 9.2 10/20/2020  ? EGFR 89 10/20/2020  ? ?Lab Results  ?Component Value Date  ? CHOL 220 (H)  10/20/2020  ? ?Lab Results  ?Component Value Date  ? HDL 40 10/20/2020  ? ?Lab Results  ?Component Value Date  ? LDLCALC 156 (H) 10/20/2020  ? ?Lab Results  ?Component Value Date  ? TRIG 134 10/20/2020  ? ?Lab Results  ?Component Value Date  ? CHOLHDL 5.5 (H) 10/20/2020  ? ?No results found for: HGBA1C ? ?   ?  Assessment & Plan:  ? ?Acute Bronchitis ?Advised saline nasal rinses ?Can continue w/ otc cold medication but caution with zyretc-D ?Rest, fluids.  ? ?I, Mikey Kirschner, PA-C have reviewed all documentation for this visit. The documentation on  09/15/2021  for the exam, diagnosis, procedures, and orders are all accurate and complete. ? ?Mikey Kirschner, PA-C ?[place] ?[id number] ?[place], [place], [address] ?Office: [phone] ?Fax: [phone]  ? ?

## 2021-10-24 ENCOUNTER — Encounter: Payer: Self-pay | Admitting: Family Medicine

## 2022-03-21 NOTE — Progress Notes (Unsigned)
Seth Graham,acting as a Education administrator for [employer], MD.,have documented all relevant documentation on the behalf of Seth Foster, MD,as directed by  Seth Foster, MD while in the presence of Seth Foster, MD.   Complete physical exam   Patient: Seth Graham.   DOB: [birth year]   56 y.o. Male  MRN: [id number] Visit Date: 03/22/2022  Today's healthcare provider: [name], MD   Chief Complaint  Patient presents with   Annual Exam   Arm Injury    Patient reports he fell on Monday and hit his left arm.   Subjective    Seth Graham. is a 56 y.o. male who presents today for a complete physical exam.  He reports consuming a general diet. Home exercise routine includes walking 2.5 hrs per week. He generally feels fairly well. He reports sleeping fairly well. He does have additional problems to discuss today.  Fall  Patient reports working on the roof for a repair when the ladder that was anchored on the deck fell. he states that he fell on his left side and hurt his left foot.  He states that he also hurt his left shoulder that has given him chronic pain.  He adds that he was once evaluated by orthopedics for his shoulder and participated in physical therapy at that time.  He denies limitation in his ability to walk or use that left shoulder.  Toenail discoloration Patient states that he has been intermittently trying topical therapies to improve the color of his toenails.  He reports that this is been a problem for several months.  He states that he has inconsistently use of topical therapy.  He states that his son was treated for toenail infection with a pill and he is interested in learning more information about this treatment.     HPI     Arm Injury    Additional comments: Patient reports he fell on Monday and hit his left arm.      Last edited by Dorian Pod, CMA on 03/22/2022  9:27  AM.        History reviewed. No pertinent past medical history. Past Surgical History:  Procedure Laterality Date   APPENDECTOMY  2004   COLONOSCOPY WITH PROPOFOL N/A 02/01/2017   Procedure: COLONOSCOPY WITH PROPOFOL;  Surgeon: Jonathon Bellows, MD;  Location: [facility];  Service: Endoscopy;  Laterality: N/A;   Social History   Socioeconomic History   Marital status: Married    Spouse name: Not on file   Number of children: Not on file   Years of education: Not on file   Highest education level: Not on file  Occupational History   Not on file  Tobacco Use   Smoking status: Never   Smokeless tobacco: Never  Vaping Use   Vaping Use: Never used  Substance and Sexual Activity   Alcohol use: No   Drug use: No   Sexual activity: Not on file  Other Topics Concern   Not on file  Social History Narrative   Not on file   Social Determinants of Health   Financial Resource Strain: Not on file  Food Insecurity: Not on file  Transportation Needs: Not on file  Physical Activity: Not on file  Stress: Not on file  Social Connections: Not on file  Intimate Partner Violence: Not on file   Family Status  Relation Name Status   Mother  Alive   Father  Alive   Sister  Alive  Sister  Alive   Family History  Problem Relation Age of Onset   COPD Father    Cancer Father        prostate and colorectal adenoma   No Known Allergies  Patient Care Team: Seth Foster, MD as PCP - General (Family Medicine)   Medications: Outpatient Medications Prior to Visit  Medication Sig   cetirizine-pseudoephedrine (ZYRTEC-D) 5-120 MG tablet Take 1 tablet by mouth daily as needed for allergies.   fluticasone (FLONASE) 50 MCG/ACT nasal spray Place 1 spray into both nostrils daily.   No facility-administered medications prior to visit.    Review of Systems  HENT:  Positive for congestion, postnasal drip, sinus pain and tinnitus.   Gastrointestinal:  Positive for blood in stool.   Musculoskeletal:  Positive for back pain.  Skin:  Positive for wound.  Allergic/Immunologic: Positive for environmental allergies.  All other systems reviewed and are negative.   Last CBC Lab Results  Component Value Date   WBC 5.3 10/20/2020   HGB 14.1 10/20/2020   HCT 41.7 10/20/2020   MCV 79 10/20/2020   MCH 26.7 10/20/2020   RDW 14.4 10/20/2020   PLT 202 33/00/7622   Last metabolic panel Lab Results  Component Value Date   GLUCOSE 108 (H) 10/20/2020   NA 140 10/20/2020   K 4.1 10/20/2020   CL 103 10/20/2020   CO2 26 10/20/2020   BUN 11 10/20/2020   CREATININE 1.00 10/20/2020   EGFR 89 10/20/2020   CALCIUM 9.2 10/20/2020   PROT 6.8 10/20/2020   ALBUMIN 4.6 10/20/2020   LABGLOB 2.2 10/20/2020   AGRATIO 2.1 10/20/2020   BILITOT 0.5 10/20/2020   ALKPHOS 102 10/20/2020   AST 17 10/20/2020   ALT 11 10/20/2020   Last lipids Lab Results  Component Value Date   CHOL 220 (H) 10/20/2020   HDL 40 10/20/2020   LDLCALC 156 (H) 10/20/2020   TRIG 134 10/20/2020   CHOLHDL 5.5 (H) 10/20/2020   Last thyroid functions Lab Results  Component Value Date   TSH 2.150 10/20/2020      Objective     BP 132/88 (BP Location: Left Arm, Patient Position: Sitting, Cuff Size: Large)   Pulse (!) 58   Temp 97.7 F (36.5 C) (Oral)   Resp 16   Ht 5' 11" (1.803 m)   Wt 211 lb 9.6 oz (96 kg)   BMI 29.51 kg/m  BP Readings from Last 3 Encounters:  03/22/22 132/88  09/15/21 (!) 137/92  07/31/21 (!) 146/96   Wt Readings from Last 3 Encounters:  03/22/22 211 lb 9.6 oz (96 kg)  09/15/21 208 lb 9.6 oz (94.6 kg)  07/31/21 211 lb 11.2 oz (96 kg)       Physical Exam Vitals reviewed.  Constitutional:      General: He is not in acute distress.    Appearance: Normal appearance. He is not ill-appearing, toxic-appearing or diaphoretic.  HENT:     Head: Normocephalic and atraumatic.     Right Ear: Tympanic membrane and external ear normal.     Left Ear: Tympanic membrane and  external ear normal.     Nose: Nose normal.     Mouth/Throat:     Mouth: Mucous membranes are moist.     Pharynx: No oropharyngeal exudate or posterior oropharyngeal erythema.  Eyes:     Extraocular Movements: Extraocular movements intact.     Conjunctiva/sclera: Conjunctivae normal.     Pupils: Pupils are equal, round, and reactive to light.  Cardiovascular:     Rate and Rhythm: Normal rate and regular rhythm.     Pulses: Normal pulses.     Heart sounds: Normal heart sounds. No murmur heard. Pulmonary:     Effort: Pulmonary effort is normal. No respiratory distress.     Breath sounds: Normal breath sounds. No wheezing, rhonchi or rales.  Musculoskeletal:        General: No swelling, tenderness or signs of injury.     Left shoulder: Decreased range of motion. Normal strength.     Cervical back: Normal range of motion and neck supple.     Right lower leg: No edema.     Left lower leg: No edema.  Skin:    General: Skin is warm and dry.     Findings: No erythema or rash.     Comments: Bilateral great toes with discolored toe nails  Neurological:     Mental Status: He is alert.         Last depression screening scores    03/22/2022    9:29 AM 10/20/2020   10:08 AM 10/20/2019    8:34 AM  PHQ 2/9 Scores  PHQ - 2 Score 1 2 0  PHQ- 9 Score _0 Last fall risk screening    03/22/2022    9:29 AM  [place] in the past year? 1  Number falls in past yr: 0  Injury with Fall? 0  Risk for fall due to : No Fall Risks  Follow up Falls evaluation completed   Last Audit-C alcohol use screening    03/22/2022    9:31 AM  Alcohol Use Disorder Test (AUDIT)  1. How often do you have a drink containing alcohol? 0  2. How many drinks containing alcohol do you have on a typical day when you are drinking? 0  3. How often do you have six or more drinks on one occasion? 0  AUDIT-C Score 0   A score of 3 or more in women, and 4 or more in men indicates increased risk for  alcohol abuse, EXCEPT if all of the points are from question 1   No results found for any visits on 03/22/22.  Assessment & Plan    Routine Health Maintenance and Physical Exam  Exercise Activities and Dietary recommendations  Goals   None     Immunization History  Administered Date(s) Administered   Influenza Inj Mdck Quad Pf 03/31/2017   Influenza,inj,Quad PF,6+ Mos 03/31/2017, 03/23/2018, 04/10/2019, 03/22/2022   Influenza-Unspecified 03/31/2017, 04/10/2019, 03/27/2021   Moderna Covid-19 Vaccine Bivalent Booster 66yr & up 04/29/2020   PFIZER(Purple Top)SARS-COV-2 Vaccination 09/18/2019, 10/09/2019   Td 12/03/2016   Tdap 10/16/2006   Zoster Recombinat (Shingrix) 03/22/2022    Health Maintenance  Topic Date Due   Hepatitis C Screening  Never done   Zoster Vaccines- Shingrix (2 of 2) 05/17/2022   TETANUS/TDAP  12/04/2026   COLONOSCOPY (Pts 45-450yrInsurance coverage will need to be confirmed)  02/02/2027   INFLUENZA VACCINE  Completed   COVID-19 Vaccine  Completed   HIV Screening  Completed   HPV VACCINES  Aged Out    Discussed health benefits of physical activity, and encouraged him to engage in regular exercise appropriate for his age and condition.  Problem List Items Addressed This Visit       Musculoskeletal and Integument   Toenail fungus    Discussed topical options versus oral treatment Counseled that oral treatment requires regular monitoring  of liver enzymes due to potential toxicity Offered additional information for patient to review prior to deciding which treatment method he would prefer Also recommended that if he would like to proceed with topical therapy that he will need to use this more consistently than intermittently as it takes a prolonged amount of time for the toenail fungus to be treated, he voiced understanding Patient given AVS with information on Lamisil         Other   Avitaminosis D    We will check vitamin D levels today       Relevant Orders   VITAMIN D 25 Hydroxy (Vit-D Deficiency, Fractures)   Annual physical exam - Primary    Hepatitis C screening, HIV screening, lipid panel, PSA, Shingrix vaccine administered, influenza vaccine administered      Relevant Orders   Comprehensive metabolic panel   Lipid Panel With LDL/HDL Ratio   VITAMIN D 25 Hydroxy (Vit-D Deficiency, Fractures)   PSA   BMI 29.0-29.9,adult    CMP collected today Lipid panel collected today      HIV screening declined   Encounter for hepatitis C virus screening test for high risk patient    Hepatitis C screening completed today      Relevant Orders   Hepatitis C antibody   Prostate cancer screening    PSA ordered      Relevant Orders   PSA   Need for shingles vaccine    Shingles vaccine administered today      Relevant Orders   Varicella-zoster vaccine IM (Completed)   Foot pain, left    , New problem Patient stable and able to ambulate This patient has multiple joint problems, recommended referral to sports medicine for further evaluations and recommendations for treatment as well as imaging evaluation      Relevant Orders   Ambulatory referral to Sports Medicine   Need for influenza vaccination    Influenza vaccine administered today      Relevant Orders   Flu Vaccine QUAD 79moIM (Fluarix, Fluzone & Alfiuria Quad PF) (Completed)   Chronic left shoulder pain    Chronic pain that has previously been evaluated by [facility] Recommended repeat evaluation in the setting of new fall       Relevant Orders   Ambulatory referral to Sports Medicine   Other Visit Diagnoses     Elevated blood-pressure reading without diagnosis of hypertension       Relevant Orders   Comprehensive metabolic panel   Lipid Panel With LDL/HDL Ratio        Return in about 3 months (around 06/21/2022) for toe nail issue .    I, MEulis Foster MD, have reviewed all documentation for this visit. The documentation on  03/22/22 for the exam, diagnosis, procedures, and orders are all accurate and complete.    MEulis Foster MD  [phone](phone) [phone](fax)  [place]

## 2022-03-22 ENCOUNTER — Encounter: Payer: BC Managed Care – PPO | Admitting: Family Medicine

## 2022-03-22 ENCOUNTER — Encounter: Payer: Self-pay | Admitting: Family Medicine

## 2022-03-22 ENCOUNTER — Ambulatory Visit (INDEPENDENT_AMBULATORY_CARE_PROVIDER_SITE_OTHER): Payer: BC Managed Care – PPO | Admitting: Family Medicine

## 2022-03-22 VITALS — BP 132/88 | HR 58 | Temp 97.7°F | Resp 16 | Ht 71.0 in | Wt 211.6 lb

## 2022-03-22 DIAGNOSIS — Z0000 Encounter for general adult medical examination without abnormal findings: Secondary | ICD-10-CM | POA: Insufficient documentation

## 2022-03-22 DIAGNOSIS — Z9189 Other specified personal risk factors, not elsewhere classified: Secondary | ICD-10-CM

## 2022-03-22 DIAGNOSIS — E559 Vitamin D deficiency, unspecified: Secondary | ICD-10-CM

## 2022-03-22 DIAGNOSIS — R030 Elevated blood-pressure reading, without diagnosis of hypertension: Secondary | ICD-10-CM | POA: Diagnosis not present

## 2022-03-22 DIAGNOSIS — Z1159 Encounter for screening for other viral diseases: Secondary | ICD-10-CM | POA: Diagnosis not present

## 2022-03-22 DIAGNOSIS — Z23 Encounter for immunization: Secondary | ICD-10-CM

## 2022-03-22 DIAGNOSIS — M25512 Pain in left shoulder: Secondary | ICD-10-CM

## 2022-03-22 DIAGNOSIS — Z125 Encounter for screening for malignant neoplasm of prostate: Secondary | ICD-10-CM | POA: Diagnosis not present

## 2022-03-22 DIAGNOSIS — Z6829 Body mass index (BMI) 29.0-29.9, adult: Secondary | ICD-10-CM

## 2022-03-22 DIAGNOSIS — Z114 Encounter for screening for human immunodeficiency virus [HIV]: Secondary | ICD-10-CM

## 2022-03-22 DIAGNOSIS — Z5320 Procedure and treatment not carried out because of patient's decision for unspecified reasons: Secondary | ICD-10-CM | POA: Insufficient documentation

## 2022-03-22 DIAGNOSIS — M79672 Pain in left foot: Secondary | ICD-10-CM

## 2022-03-22 DIAGNOSIS — B351 Tinea unguium: Secondary | ICD-10-CM

## 2022-03-22 DIAGNOSIS — G8929 Other chronic pain: Secondary | ICD-10-CM | POA: Insufficient documentation

## 2022-03-22 NOTE — Assessment & Plan Note (Signed)
Hepatitis C screening completed today 

## 2022-03-22 NOTE — Assessment & Plan Note (Signed)
Chronic pain that has previously been evaluated by [facility] Recommended repeat evaluation in the setting of new fall

## 2022-03-22 NOTE — Assessment & Plan Note (Signed)
Influenza vaccine administered today.

## 2022-03-22 NOTE — Assessment & Plan Note (Signed)
,   New problem Patient stable and able to ambulate This patient has multiple joint problems, recommended referral to sports medicine for further evaluations and recommendations for treatment as well as imaging evaluation

## 2022-03-22 NOTE — Patient Instructions (Addendum)
Terbinafine Tablets What is this medication? TERBINAFINE (TER bin a feen) treats fungal infections of the nails. It belongs to a group of medications called antifungals. It will not treat infections caused by bacteria or viruses. This medicine may be used for other purposes; ask your health care provider or pharmacist if you have questions. COMMON BRAND NAME(S): Lamisil, Terbinex What should I tell my care team before I take this medication? They need to know if you have any of these conditions: Liver disease An unusual or allergic reaction to terbinafine, other medications, foods, dyes, or preservatives Pregnant or trying to get pregnant Breast-feeding How should I use this medication? Take this medication by mouth with water. Take it as directed on the prescription label at the same time every day. You can take it with or without food. If it upsets your stomach, take it with food. Keep taking it unless your care team tells you to stop. A special MedGuide will be given to you by the pharmacist with each prescription and refill. Be sure to read this information carefully each time. Talk to your care team regarding the use of this medication in children. Special care may be needed. Overdosage: If you think you have taken too much of this medicine contact a poison control center or emergency room at once. NOTE: This medicine is only for you. Do not share this medicine with others. What if I miss a dose? If you miss a dose, take it as soon as you can unless it is more than 4 hours late. If it is more than 4 hours late, skip the missed dose. Take the next dose at the normal time. What may interact with this medication? Do not take this medication with any of the following: Pimozide Thioridazine This medication may also interact with the following: Beta blockers Caffeine Certain medications for mental health conditions Cimetidine Cyclosporine Medications for fungal infections like fluconazole  and ketoconazole Medications for irregular heartbeat like amiodarone, flecainide and propafenone Rifampin Warfarin This list may not describe all possible interactions. Give your health care provider a list of all the medicines, herbs, non-prescription drugs, or dietary supplements you use. Also tell them if you smoke, drink alcohol, or use illegal drugs. Some items may interact with your medicine. What should I watch for while using this medication? Visit your care team for regular checks on your progress. You may need blood work while you are taking this medication. It may be some time before you see the benefit from this medication. This medication may cause serious skin reactions. They can happen weeks to months after starting the medication. Contact your care team right away if you notice fevers or flu-like symptoms with a rash. The rash may be red or purple and then turn into blisters or peeling of the skin. Or, you might notice a red rash with swelling of the face, lips or lymph nodes in your neck or under your arms. This medication can make you more sensitive to the sun. Keep out of the sun, If you cannot avoid being in the sun, wear protective clothing and sunscreen. Do not use sun lamps or tanning beds/booths. What side effects may I notice from receiving this medication? Side effects that you should report to your care team as soon as possible: Allergic reactions--skin rash, itching, hives, swelling of the face, lips, tongue, or throat Change in sense of smell Change in taste Infection--fever, chills, cough, or sore throat Liver injury--right upper belly pain, loss of appetite, nausea,   light-colored stool, dark yellow or brown urine, yellowing skin or eyes, unusual weakness or fatigue Low red blood cell level--unusual weakness or fatigue, dizziness, headache, trouble breathing Lupus-like syndrome--joint pain, swelling, or stiffness, butterfly-shaped rash on the face, rashes that get worse  in the sun, fever, unusual weakness or fatigue Rash, fever, and swollen lymph nodes Redness, blistering, peeling, or loosening of the skin, including inside the mouth Unusual bruising or bleeding Worsening mood, feelings of depression Side effects that usually do not require medical attention (report to your care team if they continue or are bothersome): Diarrhea Gas Headache Nausea Stomach pain Upset stomach This list may not describe all possible side effects. Call your doctor for medical advice about side effects. You may report side effects to FDA at 1-800-FDA-1088. Where should I keep my medication? Keep out of the reach of children and pets. Store between 20 and 25 degrees C (68 and 77 degrees F). Protect from light. Get rid of any unused medication after the expiration date. To get rid of medications that are no longer needed or have expired: Take the medication to a medication take-back program. Check with your pharmacy or law enforcement to find a location. If you cannot return the medication, check the label or package insert to see if the medication should be thrown out in the garbage or flushed down the toilet. If you are not sure, ask your care team. If it is safe to put it in the trash, take the medication out of the container. Mix the medication with cat litter, dirt, coffee grounds, or other unwanted substance. Seal the mixture in a bag or container. Put it in the trash. NOTE: This sheet is a summary. It may not cover all possible information. If you have questions about this medicine, talk to your doctor, pharmacist, or health care provider.  2023 Elsevier/Gold Standard (2021-01-03 00:00:00)  

## 2022-03-22 NOTE — Assessment & Plan Note (Signed)
We will check vitamin D levels today  

## 2022-03-22 NOTE — Assessment & Plan Note (Signed)
Hepatitis C screening, HIV screening, lipid panel, PSA, Shingrix vaccine administered, influenza vaccine administered

## 2022-03-22 NOTE — Assessment & Plan Note (Signed)
Discussed topical options versus oral treatment Counseled that oral treatment requires regular monitoring of liver enzymes due to potential toxicity Offered additional information for patient to review prior to deciding which treatment method he would prefer Also recommended that if he would like to proceed with topical therapy that he will need to use this more consistently than intermittently as it takes a prolonged amount of time for the toenail fungus to be treated, he voiced understanding Patient given AVS with information on Lamisil

## 2022-03-22 NOTE — Assessment & Plan Note (Signed)
CMP collected today Lipid panel collected today

## 2022-03-22 NOTE — Assessment & Plan Note (Signed)
Shingles vaccine administered today  

## 2022-03-22 NOTE — Assessment & Plan Note (Signed)
PSA ordered

## 2022-03-23 LAB — COMPREHENSIVE METABOLIC PANEL
ALT: 11 IU/L (ref 0–44)
AST: 16 IU/L (ref 0–40)
Albumin/Globulin Ratio: 2 (ref 1.2–2.2)
Albumin: 4.5 g/dL (ref 3.8–4.9)
Alkaline Phosphatase: 95 IU/L (ref 44–121)
BUN/Creatinine Ratio: 11 (ref 9–20)
BUN: 11 mg/dL (ref 6–24)
Bilirubin Total: 0.4 mg/dL (ref 0.0–1.2)
CO2: 25 mmol/L (ref 20–29)
Calcium: 9 mg/dL (ref 8.7–10.2)
Chloride: 104 mmol/L (ref 96–106)
Creatinine, Ser: 0.98 mg/dL (ref 0.76–1.27)
Globulin, Total: 2.3 g/dL (ref 1.5–4.5)
Glucose: 105 mg/dL — ABNORMAL HIGH (ref 70–99)
Potassium: 4.1 mmol/L (ref 3.5–5.2)
Sodium: 143 mmol/L (ref 134–144)
Total Protein: 6.8 g/dL (ref 6.0–8.5)
eGFR: 91 mL/min/{1.73_m2} (ref >59)

## 2022-03-23 LAB — HEPATITIS C ANTIBODY: Hep C Virus Ab: NONREACTIVE

## 2022-03-23 LAB — VITAMIN D 25 HYDROXY (VIT D DEFICIENCY, FRACTURES): Vit D, 25-Hydroxy: 17.1 ng/mL — ABNORMAL LOW (ref 30.0–100.0)

## 2022-03-23 LAB — LIPID PANEL WITH LDL/HDL RATIO
Cholesterol, Total: 210 mg/dL — ABNORMAL HIGH (ref 100–199)
HDL: 36 mg/dL — ABNORMAL LOW (ref >39)
LDL Chol Calc (NIH): 149 mg/dL — ABNORMAL HIGH (ref 0–99)
LDL/HDL Ratio: 4.1 ratio — ABNORMAL HIGH (ref 0.0–3.6)
Triglycerides: 139 mg/dL (ref 0–149)
VLDL Cholesterol Cal: 25 mg/dL (ref 5–40)

## 2022-03-23 LAB — PSA: Prostate Specific Ag, Serum: 1.1 ng/mL (ref 0.0–4.0)

## 2022-03-23 LAB — HIV ANTIBODY (ROUTINE TESTING W REFLEX): HIV Screen 4th Generation wRfx: NONREACTIVE

## 2022-04-02 DIAGNOSIS — S92302A Fracture of unspecified metatarsal bone(s), left foot, initial encounter for closed fracture: Secondary | ICD-10-CM | POA: Diagnosis not present

## 2022-04-02 DIAGNOSIS — S5012XA Contusion of left forearm, initial encounter: Secondary | ICD-10-CM | POA: Diagnosis not present

## 2022-06-20 NOTE — Progress Notes (Signed)
I,Joseline E Rosas,acting as a scribe for [employer], MD.,have documented all relevant documentation on the behalf of Ronnald Ramp, MD,as directed by  Ronnald Ramp, MD while in the presence of Ronnald Ramp, MD.   Established patient visit   Patient: Seth Graham.   DOB: 1966-02-09   56 y.o. Male  MRN: [id number] Visit Date: 06/21/2022  Today's healthcare provider: Ronnald Ramp, MD   Chief Complaint  Patient presents with   Follow-up   Subjective    HPI  Patient here for 3 months follow up nail toe issues and second Shingles vaccine.  Toe Nail Changes  During last visit, patient preferred to apply topical agents rather than risk hepatic function  Today he states he has been using EmuaidMax and is helping. The great toes are the only ones involved, right great toe is worse  He has been applying the homeopathic topical nightly He reports noticing like a stinging or burning sensation along the nail matrix growth plate after applying the ointment     Sinuses  Patient reports longstanding hx of sinus problems  He states that he often has to clear his throat all morning and is lunch time by the lunchtime  He has been taking a cough syrup to help with symptoms  He reports while in the [employer] they often burned materials  He also reports having a ringing in his ears for several years, states that it is not bothersome and does not impact his hearing  He took the zyrtecD to help symptoms  He does not use Flonase regularly because he prefers the Navage (nasal irrigation)   Issues for Follow up visit  -sensations in hands   Medications: Outpatient Medications Prior to Visit  Medication Sig   cetirizine-pseudoephedrine (ZYRTEC-D) 5-120 MG tablet Take 1 tablet by mouth daily as needed for allergies.   fluticasone (FLONASE) 50 MCG/ACT nasal spray Place 1 spray into both nostrils daily.   No facility-administered  medications prior to visit.    Review of Systems     Objective    BP (!) 137/93 (BP Location: Left Arm, Patient Position: Sitting, Cuff Size: Normal)   Pulse 62   Resp 16   Wt 211 lb (95.7 kg)   BMI 29.43 kg/m    Physical Exam HENT:     Head: Normocephalic and atraumatic.     Right Ear: A middle ear effusion is present.     Left Ear: A middle ear effusion is present.     Nose: Congestion and rhinorrhea present.     Left Turbinates: Enlarged.     Mouth/Throat:     Tongue: No lesions.     Palate: No mass and lesions.     Pharynx: Oropharynx is clear. Uvula midline. No oropharyngeal exudate or posterior oropharyngeal erythema.       No results found for any visits on 06/21/22.   Assessment & Plan     Problem List Items Addressed This Visit       Musculoskeletal and Integument   Toenail fungus - Primary    Patient with persistent changes and discoloration on both great toes  Has been treating with homeopathic topical ointment Recommend a podiatry referral, patient agreeable with this, referral submitted today       Relevant Orders   Ambulatory referral to Podiatry     Other   Need for shingles vaccine    Shingles vaccine administered, patient tolerated well        Relevant  Orders   Varicella-zoster vaccine IM (Completed)   Tinnitus of both ears    Patient briefly mentions tinnitus and discussion of sinus issues that are chronic Reports that this is not bothersome Offer ENT referral for sinus issues along with tinnitus He denies issues with hearing Would like to hold off on ear nose and throat referral      Chronic nasal congestion    Currently intermittently using Flonase Has been treating with Zyrtec-D as of 12/25 and 12/26 with minimal improvement Reports postnasal drip, nasal congestion mostly at night Recommended daily use of Flonase as well as Zyrtec, Claritin or Allegra Patient prefers to use natural remedies over medications He declines  offer for prescription medication today He would like to hold off on ear nose and throat referral We discussed why this referral may be beneficial as the specialist can obtain better view of nasal passages, patient voiced understanding & would ike to trial Flonase for 1 month        Return in about 1 month (around 07/22/2022) for Sinuses, hand neuropathy .      I, Ronnald Ramp, MD, have reviewed all documentation for this visit.  Portions of this information were initially documented by the CMA and reviewed by me for thoroughness and accuracy.      Ronnald Ramp, MD  [facility] [phone] (phone) [phone] (fax)  [facility]

## 2022-06-21 ENCOUNTER — Encounter: Payer: Self-pay | Admitting: Family Medicine

## 2022-06-21 ENCOUNTER — Ambulatory Visit (INDEPENDENT_AMBULATORY_CARE_PROVIDER_SITE_OTHER): Payer: BC Managed Care – PPO | Admitting: Family Medicine

## 2022-06-21 VITALS — BP 137/93 | HR 62 | Resp 16 | Wt 211.0 lb

## 2022-06-21 DIAGNOSIS — B351 Tinea unguium: Secondary | ICD-10-CM | POA: Diagnosis not present

## 2022-06-21 DIAGNOSIS — R0981 Nasal congestion: Secondary | ICD-10-CM

## 2022-06-21 DIAGNOSIS — H9313 Tinnitus, bilateral: Secondary | ICD-10-CM

## 2022-06-21 DIAGNOSIS — Z23 Encounter for immunization: Secondary | ICD-10-CM

## 2022-06-21 NOTE — Assessment & Plan Note (Signed)
Currently intermittently using Flonase Has been treating with Zyrtec-D as of 12/25 and 12/26 with minimal improvement Reports postnasal drip, nasal congestion mostly at night Recommended daily use of Flonase as well as Zyrtec, Claritin or Allegra Patient prefers to use natural remedies over medications He declines offer for prescription medication today He would like to hold off on ear nose and throat referral We discussed why this referral may be beneficial as the specialist can obtain better view of nasal passages, patient voiced understanding & would ike to trial Flonase for 1 month

## 2022-06-21 NOTE — Assessment & Plan Note (Signed)
Patient with persistent changes and discoloration on both great toes  Has been treating with homeopathic topical ointment Recommend a podiatry referral, patient agreeable with this, referral submitted today

## 2022-06-21 NOTE — Patient Instructions (Signed)
Please use the Flonase nasal spray daily along with either Zyrtec (regular, not D), Claritin or Allegra to help with congestion   It will be important to use this daily to help know if it is addressing your symptoms.  Please follow up in 1 month    We have submitted a referral to podiatry for your toe nail changes, please notify our office if you do not hear anything from them in the next 2-3 weeks.

## 2022-06-21 NOTE — Assessment & Plan Note (Signed)
Patient briefly mentions tinnitus and discussion of sinus issues that are chronic Reports that this is not bothersome Offer ENT referral for sinus issues along with tinnitus He denies issues with hearing Would like to hold off on ear nose and throat referral

## 2022-06-21 NOTE — Assessment & Plan Note (Signed)
Shingles vaccine administered, patient tolerated well

## 2022-07-17 ENCOUNTER — Ambulatory Visit (INDEPENDENT_AMBULATORY_CARE_PROVIDER_SITE_OTHER): Payer: BC Managed Care – PPO | Admitting: Podiatry

## 2022-07-17 VITALS — BP 151/89 | HR 65

## 2022-07-17 DIAGNOSIS — B351 Tinea unguium: Secondary | ICD-10-CM

## 2022-07-17 MED ORDER — TERBINAFINE HCL 250 MG PO TABS: 250.0000 mg | ORAL_TABLET | Freq: Every day | ORAL | 0 refills | Status: AC

## 2022-07-17 MED ORDER — CLOTRIMAZOLE-BETAMETHASONE 1-0.05 % EX CREA: 1.0000 | TOPICAL_CREAM | Freq: Two times a day (BID) | CUTANEOUS | 2 refills | Status: AC

## 2022-07-17 NOTE — Progress Notes (Signed)
   Chief Complaint  Patient presents with   Nail Problem    Bilateral Nail fungus, started 3 years ago, right hallux nail is lifting, yellow and thick     Subjective: 57 y.o. male presenting today as a new patient for evaluation of thickening with discoloration to the bilateral great toenails as well as the right fifth digit.  Patient states that he has had toenail fungus for approximately 6-8 years.  He has tried different OTC antifungal topicals with no improvement.  His PCP referred him here for further treatment and evaluation  No past medical history on file.  Past Surgical History:  Procedure Laterality Date   APPENDECTOMY  2004   COLONOSCOPY WITH PROPOFOL N/A 02/01/2017   Procedure: COLONOSCOPY WITH PROPOFOL;  Surgeon: Jonathon Bellows, MD;  Location: [facility] ENDOSCOPY;  Service: Endoscopy;  Laterality: N/A;    No Known Allergies   Bilateral feet 07/17/2022  Objective: Physical Exam General: The patient is alert and oriented x3 in no acute distress.  Dermatology: Hyperkeratotic, discolored, thickened, onychodystrophy noted bilateral great toes and right fifth digit. Skin is warm, dry and supple bilateral lower extremities. Negative for open lesions or macerations.  Vascular: Palpable pedal pulses bilaterally. No edema or erythema noted. Capillary refill within normal limits.  Neurological: Grossly intact via light touch  Musculoskeletal Exam: No pedal deformity noted  Assessment: #1 Onychomycosis of toenails bilateral #2 chronic tinea pedis bilateral  Plan of Care:  #1 Patient was evaluated. #2  Today we discussed different treatment options including oral, topical, and laser antifungal treatment modalities.  We discussed their efficacies and side effects.  Patient opts for oral antifungal treatment modality #3 prescription for Lamisil 250 mg #90 daily. Pt denies a history of liver pathology or symptoms.  Patient is otherwise healthy.  Panel 03/22/2022 liver enzymes WNL #4  prescription for Lotrisone cream apply 2 times daily  #5 Return to clinic 6 months    Edrick Kins, DPM [facility]  Dr. Edrick Kins, DPM    [address], [place] [address]                Office [phone]  Fax [phone]

## 2023-01-15 ENCOUNTER — Ambulatory Visit: Payer: BC Managed Care – PPO | Admitting: Podiatry
